# Patient Record
Sex: Male | Born: 1960 | Race: White | Hispanic: No | Marital: Married | Smoking: Current every day smoker
Health system: Southern US, Community
[De-identification: ages and names within clinical notes are randomized; demographics above are authoritative.]

## PROBLEM LIST (undated history)

## (undated) DIAGNOSIS — R45851 Suicidal ideations: Secondary | ICD-10-CM

## (undated) DIAGNOSIS — M549 Dorsalgia, unspecified: Secondary | ICD-10-CM

## (undated) DIAGNOSIS — F410 Panic disorder [episodic paroxysmal anxiety] without agoraphobia: Secondary | ICD-10-CM

## (undated) DIAGNOSIS — F419 Anxiety disorder, unspecified: Secondary | ICD-10-CM

## (undated) DIAGNOSIS — F32A Depression, unspecified: Secondary | ICD-10-CM

## (undated) DIAGNOSIS — F329 Major depressive disorder, single episode, unspecified: Secondary | ICD-10-CM

## (undated) HISTORY — PX: NASAL SEPTUM SURGERY: SHX37

## (undated) HISTORY — PX: BACK SURGERY: SHX140

## (undated) HISTORY — PX: THUMB FUSION: SUR636

## (undated) HISTORY — PX: APPENDECTOMY: SHX54

## (undated) HISTORY — PX: HERNIA REPAIR: SHX51

---

## 1999-09-07 ENCOUNTER — Encounter: Payer: Self-pay | Admitting: Neurosurgery

## 1999-09-07 ENCOUNTER — Ambulatory Visit (HOSPITAL_COMMUNITY): Admission: RE | Admit: 1999-09-07 | Discharge: 1999-09-07 | Payer: Self-pay | Admitting: Neurosurgery

## 1999-10-06 ENCOUNTER — Encounter: Payer: Self-pay | Admitting: Neurosurgery

## 1999-10-08 ENCOUNTER — Inpatient Hospital Stay (HOSPITAL_COMMUNITY): Admission: RE | Admit: 1999-10-08 | Discharge: 1999-10-09 | Payer: Self-pay | Admitting: Neurosurgery

## 1999-10-08 ENCOUNTER — Encounter: Payer: Self-pay | Admitting: Neurosurgery

## 2000-11-07 ENCOUNTER — Ambulatory Visit (HOSPITAL_COMMUNITY): Admission: RE | Admit: 2000-11-07 | Discharge: 2000-11-07 | Payer: Self-pay | Admitting: Neurosurgery

## 2000-11-07 ENCOUNTER — Encounter: Payer: Self-pay | Admitting: Neurosurgery

## 2000-12-05 ENCOUNTER — Encounter: Payer: Self-pay | Admitting: Neurosurgery

## 2000-12-05 ENCOUNTER — Encounter: Admission: RE | Admit: 2000-12-05 | Discharge: 2000-12-05 | Payer: Self-pay | Admitting: Neurosurgery

## 2001-01-16 ENCOUNTER — Encounter: Payer: Self-pay | Admitting: Neurological Surgery

## 2001-01-16 ENCOUNTER — Emergency Department (HOSPITAL_COMMUNITY): Admission: EM | Admit: 2001-01-16 | Discharge: 2001-01-16 | Payer: Self-pay | Admitting: Emergency Medicine

## 2001-01-18 ENCOUNTER — Encounter: Admission: RE | Admit: 2001-01-18 | Discharge: 2001-01-18 | Payer: Self-pay | Admitting: Internal Medicine

## 2001-01-19 ENCOUNTER — Ambulatory Visit (HOSPITAL_COMMUNITY): Admission: RE | Admit: 2001-01-19 | Discharge: 2001-01-19 | Payer: Self-pay

## 2001-01-20 ENCOUNTER — Inpatient Hospital Stay (HOSPITAL_COMMUNITY): Admission: EM | Admit: 2001-01-20 | Discharge: 2001-01-25 | Payer: Self-pay | Admitting: Emergency Medicine

## 2001-01-20 ENCOUNTER — Encounter: Payer: Self-pay | Admitting: Internal Medicine

## 2001-01-21 ENCOUNTER — Encounter: Payer: Self-pay | Admitting: Internal Medicine

## 2001-01-22 ENCOUNTER — Encounter: Payer: Self-pay | Admitting: Internal Medicine

## 2001-02-23 ENCOUNTER — Encounter: Admission: RE | Admit: 2001-02-23 | Discharge: 2001-02-23 | Payer: Self-pay | Admitting: Internal Medicine

## 2001-02-25 ENCOUNTER — Encounter: Payer: Self-pay | Admitting: Internal Medicine

## 2001-02-25 ENCOUNTER — Ambulatory Visit (HOSPITAL_COMMUNITY): Admission: RE | Admit: 2001-02-25 | Discharge: 2001-02-25 | Payer: Self-pay | Admitting: Internal Medicine

## 2001-06-22 ENCOUNTER — Inpatient Hospital Stay (HOSPITAL_COMMUNITY): Admission: RE | Admit: 2001-06-22 | Discharge: 2001-06-26 | Payer: Self-pay | Admitting: Neurosurgery

## 2001-06-22 ENCOUNTER — Encounter: Payer: Self-pay | Admitting: Neurosurgery

## 2002-01-31 ENCOUNTER — Encounter: Payer: Self-pay | Admitting: Neurosurgery

## 2002-01-31 ENCOUNTER — Ambulatory Visit (HOSPITAL_COMMUNITY): Admission: RE | Admit: 2002-01-31 | Discharge: 2002-01-31 | Payer: Self-pay | Admitting: Neurosurgery

## 2003-03-03 ENCOUNTER — Ambulatory Visit (HOSPITAL_COMMUNITY): Admission: RE | Admit: 2003-03-03 | Discharge: 2003-03-03 | Payer: Self-pay | Admitting: General Surgery

## 2003-03-03 ENCOUNTER — Encounter: Payer: Self-pay | Admitting: General Surgery

## 2003-09-24 ENCOUNTER — Ambulatory Visit (HOSPITAL_COMMUNITY): Admission: RE | Admit: 2003-09-24 | Discharge: 2003-09-24 | Payer: Self-pay | Admitting: General Surgery

## 2010-03-26 ENCOUNTER — Ambulatory Visit (HOSPITAL_COMMUNITY): Admission: RE | Admit: 2010-03-26 | Discharge: 2010-03-26 | Payer: Self-pay | Admitting: General Surgery

## 2010-08-21 ENCOUNTER — Encounter: Payer: Self-pay | Admitting: General Surgery

## 2010-10-15 LAB — CLOTEST (H. PYLORI), BIOPSY: Helicobacter screen: NEGATIVE — AB

## 2010-12-17 NOTE — Discharge Summary (Signed)
[place]. [facility]  Patient:    Frank Mills, Frank Mills                    MRN: [id number] Adm. Date:  [id number] Disc. Date: [id number] Attending:  Emeterio Reeve                           Discharge Summary  ADMITTING DIAGNOSIS:  Displaced disk, L5-S1, causing left lower extremity pain.   DISCHARGE DIAGNOSES:  Displaced disk, L5-S1, causing left lower extremity pain.  PROCEDURES:  Lumbar semi-hemilaminectomy, L5-S1, with microdissection.  COMPLICATIONS:  None.  DISCHARGE STATUS:  Alive and well.  HOSPITAL COURSE:  [name] at the time was admitted for evaluation of a herniated disk at L5-S1 causing left lower extremity pain.  He underwent an uncomplicated diskectomy at L5-S1.  Postoperatively, he has 5/5 strength except for some slight plantar flexion weakness on the left side, 5-/5.  His dressing is dry. He is able to ambulate, void, and was tolerating a regular diet.  He was given instructions of no bending, lifting, twisting, or driving.  He will be sent home and have a return appointment in approximately two weeks. DD:  10/09/99 TD:  10/10/99 Job: [id number]

## 2010-12-17 NOTE — H&P (Signed)
[place]. [facility]  Patient:    Frank Mills, KILLE Visit Number: [id number] MRN: [id number]          Service Type: Attending:  Payton Doughty, M.D. Dictated by:   Payton Doughty, M.D. Adm. 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  HISTORY OF PRESENT ILLNESS:  This is a 50 year old, right-handed, white gentleman who had a diskectomy at L5-S1 in March of 2001.  He did reasonably well and then last winter has increased low back pain and pain down his left leg.  He underwent epidural steroids which apparently resulted in some infection.  He was treated by the medical service.  MRIs were obtained that showed increase in disk on the left side at L4-5.  Because of progression of disease, intractable pain in his back and down his left leg, he is now admitted for a L4-5 and L5-S1 fusion.  PAST MEDICAL HISTORY:  Otherwise fairly unremarkable.  MEDICATIONS:  Vicodin.  ALLERGIES:  None.  PAST SURGICAL HISTORY:  Traumatic amputation of the thumb in 1993 and hernia repair in 1996.  SOCIAL HISTORY:  He smokes half of a pack of cigarettes a day.  He does not drink alcohol.  He is not working.  FAMILY HISTORY:  His mother is not known.  His father has what is described as fluid on his brain.  REVIEW OF SYSTEMS:  Remarkable for leg weakness, back pain, leg pain, and pain in the hip.  PHYSICAL EXAMINATION:  His HEENT exam is within normal limits.  NECK:  He has range of motion.  CHEST:  A few crackles.  CARDIAC:  Regular rate and rhythm.  ABDOMEN:  Nontender.  No hepatosplenomegaly.  EXTREMITIES:  No clubbing or cyanosis.  Peripheral pulses are good.  GENITOURINARY:  Exam is deferred.  NEUROLOGIC:  He is awake, alert, and oriented.  His cranial nerves are intact. The motor exam shows 5/5 strength throughout the upper and lower extremities at this time.  He has a positive straight leg  raising on the left and positive reverse straight leg raising per left leg pain with right leg pain.  Reflexes were intact save for the right ankle jerk which was absent.  PLAN:  Lumbar fusion at both L4-5 and L5-S1.  The risks and benefits of this approach have been discussed with him and he wishes to proceed. Dictated by:   Payton Doughty, M.D. Attending:  Payton Doughty, M.D. DD:  06/22/01 TD:  06/22/01 Job: [phone] [id number]

## 2010-12-17 NOTE — H&P (Signed)
[place]. [facility]  Patient:    Frank Mills, Frank Mills                      MRN: Adm. Date:  10/08/99 Attending:  Payton Doughty, M.D.                         History and Physical  ADMISSION DIAGNOSIS:  Herniated disk at L5-S1.  HISTORY OF PRESENT ILLNESS:  This is a 50 year old right-handed white gentleman who noted back pain in 1998.  He had a fall in 1996 that may have started this.  He has had an MRI that showed pathology at 5-1 in 1998.  He had several bouts of epidural steroids, physical therapy to 10 units, and he has worsened pain in his back, into his right leg, and into his right foot.  In June, he was studied with an MRI which showed a central disk at 5-1.  He came to me from a chiropractors office.  He was studied with an MRI that showed a 5-1 disk as well as a left-sided extraforaminal disk.  He has had an increase in his left leg pain and is bothered bilaterally.  MRI shows extraforaminal disk on the left side at 4-5 and at 5-1, and he is now  admitted for diskectomy.  PAST MEDICAL HISTORY:  Otherwise unremarkable.  MEDICATIONS:  None.  ALLERGIES:  None.  PAST SURGICAL HISTORY:  Traumatic amputation of left thumb in 1993, a hernia repair in 1996.  SOCIAL HISTORY:  He smokes one-half pack of cigarettes per day, does not drink alcohol.  He is not working at this time.  FAMILY HISTORY:  Mom is unknown.  Daddy is 67 with fluid on his brain.  REVIEW OF SYSTEMS:  Remarkable for leg weakness, back pain, leg pain, and pain n his hip.  PHYSICAL EXAMINATION:  HEENT:  Within normal limits.  NECK:  He has a reasonable range of motion of his neck.  CHEST:  A few crackles.  CARDIAC:  Regular rate and rhythm.  ABDOMEN:  Nontender, no hepatosplenomegaly.  EXTREMITIES:  Without clubbing or cyanosis.  Peripheral pulses are good.  GU:  Deferred.  NEUROLOGIC:  He is awake, alert, and oriented.  His cranial nerves are intact.  Motor  exam shows 5/5 strength throughout upper and lower extremities save for plantar flexion weakness on the right side.  Sensory deficit describes right S1  distribution.  Deep tendon reflexes are 2 at the knees, 1 at the ankles bilaterally.  Straight leg raise and reverse straight raise are both exquisitely positive for back and right hip and leg pain.  LABORATORY DATA:  MRI results were reviewed above.  CLINICAL IMPRESSION:  Right S1 radiculopathy.  The onset of left leg pain may be related to the 4-5 disk.  We are going to check him again and certainly take the 5-1 disk out and perhaps explore extraforamin t L4-L5.  The risks and benefits of this approach have been discussed with him, and he wishes to proceed. DD:  10/08/99 TD:  10/08/99 Job: [id number]

## 2010-12-17 NOTE — Op Note (Signed)
[place]. [facility]  Patient:    Frank Mills, Frank Mills Visit Number: [id number] MRN: [id number]          Service Type: SUR Location: 3000 [id number] Attending Physician:  Emeterio Reeve Dictated by:   Payton Doughty, M.D. Proc. Date: 06/22/01 Admit Date:  06/22/2001                             Operative Report  PREOPERATIVE DIAGNOSIS:  Spondylosis of L5-S1 and herniated disk, L4-5.  POSTOPERATIVE DIAGNOSIS:  Spondylosis of L5-S1 and herniated disk, L4-5.  OPERATIVE PROCEDURE:  L4-5 and L5-S1 laminectomy and diskectomy, posterior lumbar interbody fusion with a Ray threaded fusion cage.  SURGEON:  Payton Doughty, M.D.  ASSISTANT:  Mena Goes. Franky Macho, M.D.  ANESTHESIA:  General endotracheal.  PREPARATION:  Prepped with sterile Betadine, and prepped and scrubbed with alcohol wipe.  COMPLICATIONS:  None.  INDICATIONS:  A 50 year old right-handed white gentleman with severe spondylosis.  DESCRIPTION OF PROCEDURE:  He was taken to the operating room, smoothly anesthetized and intubated.  Placed prone on the operating table.  Following shave, prep, and drape in the usual sterile fashion, the skin was infiltrated with 1% lidocaine, 1:_______ epinephrine.  The skin was incised from the top of the L4 to mid S1, and the lamina of L4 and L5 were exposed bilaterally in the subperiosteal plane over the 4-5 and 5-1 facet joints.  Intraoperative x-ray confirmed correctness of level.  The pars interarticularis, remaining lamina, and inferior facet of L4 and L5, and the superior facet of L5 and S1 were removed bilaterally, and the bone set aside for grafting.  Following this, the nerve roots were dissected as _________ their respective pedicles by removal of the ligamentum flavum and coagulation of the epidural veins.  Diskectomy was then carried out at each level.  On the left side at 4-5, there was a herniated disk in the neuroforamen which compressed both the 4 and 5  roots as they traversed this area.  At L5-S1, there was significant degenerative disease and bilateral foraminal narrowing.  Following complete resection of the disk, the 14 x 26 mm Ray cages were placed using standard technique.  Following this, intraoperative x-ray showed good placement of the cages.  They were packed with bone graft, harvested with facet joints, and capped.  The wound was once again irrigated, hemostasis assured, and the fascia was reapproximated with 0 Vicryl in an interrupted fashion.  The subcutaneous tissue was reapproximated with 0 Vicryl in an interrupted fashion.  The subcuticular tissues reapproximated with 3-0 Vicryl in an interrupted fashion and the skin was closed with 3-0 nylon in a running locked fashion.  Betadine Telfa dressing was applied and made occlusive with Op-Site.  The patient then returned to the recovery room in good condition. Dictated by:   Payton Doughty, M.D. Attending Physician:  Emeterio Reeve DD:  06/22/01 TD:  06/24/01 Job: [id number]

## 2010-12-17 NOTE — Op Note (Signed)
. [facility]  Patient:    Frank Mills, Frank Mills                    MRN: [id number] Proc. Date: 10/08/99 Adm. Date:  [id number] Disc. Date: [id number] Attending:  Emeterio Reeve                           Operative Report  PREOPERATIVE DIAGNOSIS:  Herniated disk at L5-S1.  POSTOPERATIVE DIAGNOSIS:  Herniated disk at L5-S1.  PROCEDURE:  L5-S1 bilateral laminectomy and diskectomy.  SURGEON:  Payton Doughty, M.D.  ANESTHESIA:  General endotracheal.  PREP:  Shave and prepped, scrubbed with alcohol wipe.  COMPLICATIONS:  None.  BODY OF TEXT:  A 50 year old right-handed, white gentleman with bilateral S1 radiculopathies.  Some consideration had been given to doing a 4-5 extraforaminal disk, but he had no L4 or L5 symptoms.  He was taken to the operating room, smoothly anesthetized, intubated and placed prone on the operating table. Following shave, prepped and draped in usual sterile fashion.  Skin was infiltrated with 1% lidocaine with 1:400,000 epinephrine.  Skin incision was made from mid 1 to mid L5 and the lamina of L5 and S1 were exposed bilaterally in subperiosteal  plane.  Intraoperative x-ray confirmed correctness level.  Hemisemilaminectomy as carried out first on the right and then on the left to the top and bottom of the ligamentum flavum, removing part of the lamina of L5 and part of the lamina of 1 on each side.  Ligamentum flavum was removed and careful dissection revealed the S1 nerve root as it coursed across the disk space and out around its pedicle.  On he right side, there was a significant bulging disk with some inferiorly migrated fragment that was removed without difficulty.  This resolved the immediate compression of the right S1 nerve root.  On the left side, there was also bulging disk, but the disk herniation right at the disk space.  It was protruding through the annulus, but was not a true free fragment.  Annular  fibers were divided and the disk was removed.  The disk space was carefully explored bilaterally and all marginally clinging fragments were removed.  Following this, the wound was irrigated and hemostasis assured.  Both S1 nerve roots were explored, as well as, the anterior epidural space and no fragments were demonstrated.  Depo Medrol-soaked fat was then placed in each laminectomy defect.  The fascia was reapproximated ith 0 Vicryl in interrupted fashion, subcutaneous tissues reapproximated with 0 Vicryl in interrupted fashion, the subcuticular tissues reapproximated with 3-0 Vicryl in interrupted fashion and the skin was closed with 4-0 Vicryl in a running subcuticular fashion.  Benzoin and Steri-Strips were placed and made occlusive Telfa and OpSite.  The patient was then returned recovery room in good condition. DD:  10/08/99 TD:  10/10/99 Job: [id number]

## 2010-12-17 NOTE — Discharge Summary (Signed)
[place]. [facility]  Patient:    MADISON, ALBEA                    MRN: [id number] Adm. Date:  [id number] Disc. Date: [id number] Attending:  Madaline Guthrie CC:         Dr. Channing Mutters   Discharge Summary  DATE OF BIRTH:  [birth year]  FOLLOWED BY:  Dr. Channing Mutters, neurosurgeon, [phone].  DISCHARGE DIAGNOSES: 1. Parameningeal focus of infection. 2. Ileus secondary to long-term chronic pain medicine. 3. Urinary retention, also secondary to long-term narcotic pain medicine. 4. Disc herniation on left at L4-L5 diagnosed by MRI on April 2002. 5. Disc surgery on L5-S1 April 2001.  DISCHARGE MEDICATIONS: 1. Ceftriaxone 2 g IV q.12h. x 14 days, ceftriaxone 2 g IV q.d. for a month,    then start gatifloxacin 400 mg p.o. q.d. x one month. 2. Percocet 5/325 two tabs q.6h. p.r.n. pain. 3. Senokot 2 tabs q.i.d. p.r.n. while taking Percocet or p.r.n. constipation.  FOLLOWUP:  The patient is scheduled to see Dr. Felicity Coyer at [facility] on July 26 at 10:45 a.m.  At that time, it is recommended by the infectious disease consult to check a Sed rate as well as schedule the patient for a followup MRI of the spine looking for remaining site of infection or abscess. The patient is also scheduled to follow up with his neurosurgeon, Dr. Channing Mutters, at his office on September 11 at 4:15 p.m. in regards to his disc herniation.  PROCEDURES: 1. Chest x-ray showed lungs to be clear without effusion or pneumothorax. 2. Lumbar puncture showed 1500 white cells, 1070 red cells with 83%    neutrophils.  Glucose less than 5.  Protein 337 and Gram stain showed    gram-positive cocci which later grew as staph aureus methicillin-sensitive. 3. CT of the abdomen, pelvis showed ______ abscess, showed a dilated colon    with moderate amounts of stool, most likely ileus without any acute    abnormalities within the abdomen. 4. PICC line placement on hospital day #3.  CONSULTANTS:  An infectious disease  consult was asked for.  They advised starting treatment with IV vancomycin and ceftriaxone.  Once cultures with sensitivities were back and discovered to be methicillin staph aureus, the patient was switched to ceftriaxone only.  It was recommended that the antibiotic regimen consist of two weeks of 2 g IV ceftriaxone every 12 hours followed by one month of ceftriaxone 2 g IV once a day, followed by one month of oral gatifloxacin.  It was also suggested that the patient be followed with Sed rates as well as a repeat MRI of the spine at one month looking for any remaining sites of infection.  ADMITTING HISTORY AND PHYSICAL:  The patient is a 50 year old white male with a history of herniated disc surgery in April 2001.  He presents with several months of intractable back pain as well as pain in the hips, backs of legs bilaterally and neck.  The patient states that he recovered well from his back surgery approximately a year ago, but then started to feel pain in February 2002.  It progressively got worse, but he was imaged with an MRI in April 2002, where it was shown that he had developed a disc herniation on the left at L4-L5.  He was then scheduled by his neurosurgeon, Dr. Channing Mutters, to receive a course of three epidural steroid injections.  He received his first dose in May  2002 and states that this made him fell worse so that he refused to keep appointments for the subsequent two steroid injections.  Since then, he has felt like his pain had gotten progressively worse so that he had been visiting area ERs seeking diagnoses and control of his pain.  He presented to the [facility] Emergency Room four days prior to this admission with the same complaint where he was found to have leukocytosis with a white blood cell count of 17.5. Because of his symptoms and leukocytosis, a repeat MRI and full-body bone scan were arranged as well as a followup outpatient clinic on the day prior to admission.  A  repeat MRI showed the same L4-L5 disc herniation but otherwise was unchanged since his previous exam in April 2002.  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  Blood cultures were also drawn at that ER visit, but have remained negative to date.  A Sed rate was also shown to be normal.  He did also test positive for cocaine on a urine drug screen. At the emergency room visit on the day of admission, the patient had very similar complaints and said that this pain had continued without relief.  It had been constant sharp and there was no associated numbness.  He denies any stress incontinence to urine or stool.  His complaints were otherwise nonfocal as well as his exam.  He did have subjective feelings of fever and chills, but he had been taking large doses of pain medications including Percocet, darvocet, which included antipyretics.  He denied any complaints of headache, photophobia, nausea or vomiting.  At presentation, continued to have a leukocytosis of 17.7 and an ANC of 15.4.  It was therefore decided to do a lumbar puncture, which showed evidence of a bacterial infection with gram-positive cocci on Gram stain.  However, this was a very unusual presentation given the fact that the patient did not show any signs of frank meningitis.  Despite going over the previous spine MRIs a second time with radiologist, a focus of infection was still not shown on imaging.  Regardless, the patient was admitted to the hospital and started on IV antibiotics.  HOSPITAL COURSE: #1 - PARAMENINGEAL FOCUS OF INFECTION:  given the results of the lumbar puncture, the patient seemed to have a bacterial source of infection near or within his meninges.  He was therefore started on broad spectrum antibiotics, IV antibiotics, covered with vancomycin and ceftriaxone.  Sensitivities returned on day #3 of  hospitalization and showed methicillin sensitive staph  aureus.  The vancomycin was therefore discontinued and the patient was kept only on ceftriaxone IV.  Given the fact that the patient would have to have 4-6 weeks of IV antibiotics, he had a PICC placement also on hospital day #3. Over the course of his hospitalization, the patient had decreased episodes of chills and he defervesced nicely.  On discharge, his white blood cell count was 13.2.  He also required less and less pain medication to control his pain. The patient was discharged on a six week course of ceftriaxone as detailed above.  Home health nurse was arranged to deliver the antibiotics on a daily basis.  A follow-up appointment was made in the residents [facility] clinic in a month in order to assess how well the patient will respond.  At that time, the patient will be switched to oral gatifloxacin for  one month further of antibiotics.  He will also have a Sed rate checked and a follow-up MRI of the spine will be scheduled.  #2 - CONSTIPATION:  On admission, the patient stated that he had at least one week without a stool.  It is postulated that this is most likely secondary to his several week use of narcotic pain medications that had been given to him in the almost daily emergency room visits that he had been seeking.  This was confirmed on his CT of the abdomen and pelvis where the colon was dilated with moderate amounts of stool.  He was therefore started on Senokot at first, supplemented by GoLYTELY.  By hospital day #5, the patient was stooling regularly and his symptoms has resolved.  #3 - URINARY RETENTION:  On presentation in the emergency room, the patient stated that he had not been able to urinate that day.  A Foley catheter was placed with the result of approximately 1200 cc of urine.  The patient then insisted on taking the Foley catheter out.  However, he was unable to produce urine over the next several hours, so  a Foley catheter was reintroduced.  It was again most likely this his urinary retention is a result of his several week narcotic pain medication use.  As his pain relatively decreased as the infection was treated, he was requiring lower and lower doses of narcotic pain medicines so that by hospital day #6, the day of discharge, the Foley catheter could be withdrawn and he was able to produce urine freely without having to strain.  #4 - PAIN CONTROL:  The patients chief complaint was severe pain.  He was already on several different narcotic medicines including Percocet as well as Ultram.  He was started on a PCA pump on hospital day #2, which did well at controlling his pain.  He was requiring as much as 120 mg of morphine at that time.  On hospital day #3, he was taken off the PCA and started on MS Contin at an equivalent dose.  On hospital day #5, his mental status changed so that he became very lethargic and only alert and oriented x 2.  However, exam was nonfocal.  His somnolence was thought to be due secondary to the high dose of MS Contin that he was on.  His MS Contin was therefore held for 24 hours and his mental status cleared, so that he was alert and oriented x 3 by the following morning.  It was decided then to discharge him home on Percocet 5/325 two tabs q.6h. in the hopes that this would adequately treat his pain without making his somnolent, constipated, or have urinary retention.  He was given the phone number of the [facility] and advised to give the clinic a call if his pain control was inadequate.  DISCHARGE LABORATORY DATA:  Sodium 136, potassium 3l9, chloride 99, bicarb 30, BUN 7, creatinine 0.7, glucose 99, calcium 8.5, white blood cell count 13.0. Hemoglobin 13.2, hematocrit 38.9, platelets 351. DD:  01/26/01 TD:  01/26/01 Job: [id number]

## 2010-12-17 NOTE — Consult Note (Signed)
[place]  Patient:    Frank Mills, Frank Mills                    MRN: [id number] Proc. Date: 01/16/01 Adm. Date:  [id number] Attending:  Molpus, Carlisle Beers                          Consultation Report  REQUESTOR:  Janyce Llanos, M.D.  REASON FOR CONSULTATION:  Intractable back pain.  HISTORY OF PRESENT ILLNESS:  The patient is a 50 year old individual who is a patient of Dr. 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  He has been treated with some epidural steroid injections which he had a month ago.  According to the history the patient has had numerous visits to the emergency room at [facility] and has been given shots for pain medication.  He presented to the emergency room today writhing with back pain and was inconsolable.  He was given a few mg of IV morphine which made him somewhat more comfortable.  Urine specimen was positive for cocaine, which the patient admits to have taken to try to ameliorate the pain.  Because of the history of epidural steroid injections a month ago, CBC and sed rate was suggested, and a repeat MRI was also order.  The repeat MRI has demonstrated that the patient has a foraminal disc herniation on the left at L4/L5 which is unchanged from before.  The patient also has evidence of an elevated white count of 17,500 with 90% polymorphonuclear cells and some toxic granulation is noted.  The patients urinalysis is otherwise unremarkable.  There are no red cells or white cells within the urine.  PHYSICAL EXAMINATION:  GENERAL:  The patient described diffuse back pain.  He has been quite heavily sedated.  EXTREMITIES:  He has no overt muscle spasm to palpation or percussion.  His motor strength in the lower extremities is good to confrontational testing. Deep tendon reflexes are 1+ in the patellae, 1+ in the Achilles.  Babinskis are downgoing.  Straight leg  raise reproduces back pain locally.  There is no masses palpable in the groin.  Femoral pulses are intact and peripheral pulses are intact.  No edema is noted in the distal lower extremities.  LUNGS:  Clear to auscultation.  HEART:  Regular rate and rhythm.  ABDOMEN:  Soft.  IMPRESSION/PLAN:  The patient at this time appears to have some diffuse nonspecific back pain.  There is no specific localizing feature in his lumbar spine.  The disc herniation that he has at L4/L5 appears to be unchanged from description from the study in April 2002 and his bilateral leg pain does not fit with the presence of a foraminal disc herniation at L4/L5 on left.  I discussed the situation with Dr. Samuel Germany at some length.  My concern is the nature of his white count elevation.  He has not been febrile and has no other signs of infection.  At this time he will be ordering some blood cultures to be followed up in the medical clinic in the next two days.  I suggested giving the patient Ultram for pain and discussed the patients pain medication usage. His girlfriend notes that he has been using four to five hydrocodone at a time.  I indicated the amount of Tylenol that he is exposing himself to is in  fact dangerous to his liver and his bloodstream.  A bone scan will also be ordered by Dr. Samuel Germany.  The patient will be following up with Dr. Channing Mutters at his routinely scheduled appointment at the end of this month. DD:  01/16/01 TD:  01/16/01 Job: [id number]

## 2010-12-17 NOTE — Consult Note (Signed)
[place]  Patient:    Frank Mills, Frank Mills Visit Number: [id number] MRN: [id number]          Service Type: SUR Location: 3000 3022 [id number] Attending Physician:  Emeterio Reeve Dictated by:   Bertram Millard. Dahlstedt, M.D. Proc. Date: 06/26/01 Admit Date:  06/22/2001   CC:         Payton Doughty, M.D.                          Consultation Report  REASON FOR CONSULTATION:  Inability to urinate.  BRIEF HISTORY:  A 50 year old male status post recent lumbar surgery.  He had a prior operation several years ago.  He had urinary retention after that and true to form, has had urinary retention after this surgery.  The patient was given a trial of Flomax yesterday which did not help with urination.  Catheter was replaced.  The patient has had significant suprapubic discomfort as well. He has the constant need to urinate.  This has been improved overnight with pain management.  Prior to hospitalization the patient denies any voiding symptoms.  He emptied well and had no significant history of urinary tract infections.  PAST UROLOGIC HISTORY:  Noncontributory except for prior retention.  PHYSICAL EXAMINATION  GENERAL:  Somewhat disheveled appearing adult male.  ABDOMEN:  Flat, soft, nondistended with mild suprapubic tenderness.  Bladder was nonpalpable.  There were no inguinal hernias.  No hepatosplenomegaly or masses were present.  GENITOURINARY:  Phallus circumcised without lesions.  No plaques, fibrotic areas noted.  Glans normal.  Meatus normal location and size.  Catheter present from the meatus.  Scrotal skin unremarkable.  Testicles unremarkable.  RECTAL:  Normal anal sphincter tone.  Gland 1-2+.  No tenderness.  No nodularity.  No rectal masses.  Seminal vesicles nonpalpable.  IMPRESSION: 1. Retention, specified, postoperative. 2. Suprapubic discomfort.  PLAN: 1. Agree to continue him on Flomax. 2. Will leave catheter in until Friday  morning. 3. Discussed taking this catheter out with his wife.  They will remove it    first thing for a voiding trial. 4. If he voids well I will follow up with him in two weeks.  If he does not    void well he will call me that day for further management. Dictated by:   Bertram Millard. Dahlstedt, M.D. Attending Physician:  Emeterio Reeve DD:  06/26/01 TD:  06/26/01 Job: [id number]

## 2012-01-19 ENCOUNTER — Emergency Department (HOSPITAL_COMMUNITY)
Admission: EM | Admit: 2012-01-19 | Discharge: 2012-01-19 | Disposition: A | Discharge status: Other Acute Inpatient Hospital | Payer: Medicare Other | Source: Home / Self Care | Attending: Emergency Medicine | Admitting: Emergency Medicine

## 2012-01-19 ENCOUNTER — Encounter (HOSPITAL_COMMUNITY): Payer: Self-pay

## 2012-01-19 DIAGNOSIS — R45851 Suicidal ideations: Secondary | ICD-10-CM | POA: Insufficient documentation

## 2012-01-19 DIAGNOSIS — T5891XA Toxic effect of carbon monoxide from unspecified source, accidental (unintentional), initial encounter: Secondary | ICD-10-CM

## 2012-01-19 DIAGNOSIS — F3289 Other specified depressive episodes: Secondary | ICD-10-CM | POA: Insufficient documentation

## 2012-01-19 DIAGNOSIS — T5894XA Toxic effect of carbon monoxide from unspecified source, undetermined, initial encounter: Secondary | ICD-10-CM | POA: Insufficient documentation

## 2012-01-19 DIAGNOSIS — F329 Major depressive disorder, single episode, unspecified: Secondary | ICD-10-CM | POA: Insufficient documentation

## 2012-01-19 DIAGNOSIS — T5802XA Toxic effect of carbon monoxide from motor vehicle exhaust, intentional self-harm, initial encounter: Secondary | ICD-10-CM | POA: Insufficient documentation

## 2012-01-19 HISTORY — DX: Depression, unspecified: F32.A

## 2012-01-19 HISTORY — DX: Dorsalgia, unspecified: M54.9

## 2012-01-19 HISTORY — DX: Panic disorder (episodic paroxysmal anxiety): F41.0

## 2012-01-19 HISTORY — DX: Anxiety disorder, unspecified: F41.9

## 2012-01-19 HISTORY — DX: Major depressive disorder, single episode, unspecified: F32.9

## 2012-01-19 HISTORY — DX: Suicidal ideations: R45.851

## 2012-01-19 LAB — CBC
HCT: 44.6 % (ref 39.0–52.0)
MCH: 31.7 pg (ref 26.0–34.0)
MCHC: 33.6 g/dL (ref 30.0–36.0)
MCV: 94.3 fL (ref 78.0–100.0)
RDW: 13.3 % (ref 11.5–15.5)

## 2012-01-19 LAB — DIFFERENTIAL
Basophils Absolute: 0.1 10*3/uL (ref 0.0–0.1)
Basophils Relative: 1 % (ref 0–1)
Eosinophils Absolute: 0.5 10*3/uL (ref 0.0–0.7)
Eosinophils Relative: 4 % (ref 0–5)
Monocytes Absolute: 0.6 10*3/uL (ref 0.1–1.0)
Monocytes Relative: 5 % (ref 3–12)

## 2012-01-19 LAB — RAPID URINE DRUG SCREEN, HOSP PERFORMED
Amphetamines: NOT DETECTED
Cocaine: POSITIVE — AB
Opiates: NOT DETECTED
Tetrahydrocannabinol: NOT DETECTED

## 2012-01-19 LAB — COMPREHENSIVE METABOLIC PANEL
AST: 21 U/L (ref 0–37)
Albumin: 3.6 g/dL (ref 3.5–5.2)
BUN: 10 mg/dL (ref 6–23)
Calcium: 9.4 mg/dL (ref 8.4–10.5)
Creatinine, Ser: 1.19 mg/dL (ref 0.50–1.35)

## 2012-01-19 LAB — CARBOXYHEMOGLOBIN
Methemoglobin: 1 % (ref 0.0–1.5)
O2 Saturation: 99.6 %
Total hemoglobin: 14.7 g/dL (ref 13.5–18.0)
Total oxygen content: 20.7 mL/dL (ref 15.0–23.0)

## 2012-01-19 LAB — BLOOD GAS, ARTERIAL
Acid-Base Excess: 0.6 mmol/L (ref 0.0–2.0)
TCO2: 21.4 mmol/L (ref 0–100)
pCO2 arterial: 38.8 mmHg (ref 35.0–45.0)

## 2012-01-19 LAB — CARDIAC PANEL(CRET KIN+CKTOT+MB+TROPI)
CK, MB: 1.7 ng/mL (ref 0.3–4.0)
Troponin I: <0.3 ng/mL (ref <0.30)

## 2012-01-19 LAB — ETHANOL: Alcohol, Ethyl (B): <11 mg/dL (ref 0–11)

## 2012-01-19 MED ORDER — LORAZEPAM 1 MG PO TABS: 1.0000 mg | ORAL_TABLET | Freq: Three times a day (TID) | ORAL | Status: DC | PRN

## 2012-01-19 MED ORDER — ACETAMINOPHEN 325 MG PO TABS: 650.0000 mg | ORAL_TABLET | ORAL | Status: DC | PRN

## 2012-01-19 MED ORDER — ONDANSETRON HCL 4 MG PO TABS: 4.0000 mg | ORAL_TABLET | Freq: Three times a day (TID) | ORAL | Status: DC | PRN

## 2012-01-19 MED ORDER — NICOTINE 21 MG/24HR TD PT24
21.0000 mg | MEDICATED_PATCH | Freq: Every day | TRANSDERMAL | Status: DC
  Filled 2012-01-19: qty 1

## 2012-01-19 NOTE — ED Notes (Signed)
Patient reports SI for 1-2 weeks. "I don't know I guess I'm just bored" Patient chuckling at times when giving explanation of why he is in the ED. Patient reports he tried to kill himself by locking himself in the car with a hose from the exhaust. Patient is alert/oriented x 4 at present and cooperative with nursing staff and police. [employer] at bedside for patient observation.

## 2012-01-19 NOTE — ED Notes (Signed)
Pt brought to er by [facility], commitment papers in place, admits to si, denies any si, stated he has been depressed for a long while. Hooked pipe to car trying to kill self today.  Admits to Cocaine abuse. Denies any etoh use.

## 2012-01-19 NOTE — Progress Notes (Signed)
Pt accepted to [facility] by Dr Orson Aloe to room 504-2.  Dr Manus Gunning agrees with disposition.

## 2012-01-19 NOTE — BH Assessment (Signed)
Assessment Note   Frank Mills is an 51 y.o. male.  PT REPORTS HE HAD NEVER FELT SUICIDAL UNTIL ABOUT 2 WEEKS AGO HE HAD SUICIDAL THOUGHTS AFTER HAVING A COCAINE BINGE AND THEN AGAIN TODAY AFTER BINGING ON ABOUT  $140.00 WORTH OF COCAINE .  HE REPORTS WHILE BINGING LAST NIGHT HE REFUSED TO TAKE HIS WIFE'S COLLECT CALLS FROM JAIL AND HE REALIZED HE LET HIS SON DOWN AND THEN WANTED TO TAKE HIS LIFE. PT REPORTS NEVER FEELING THIS DEPRESSED BEFORE AND ALL THIS IS NEW TO HIM. HE REPORTS BEING GIVEN A NEW MEDICATION FOR INFLAMMATION AS IT RELATES TO HIS BACK PAIN  AND FEELS THIS MEDICATION ALONG WITH HIS CRACK/COCAIN USE CAUSED HIM TO FEEL SUICIDAL. PT DENIES H/I AND IS NOT PSYCHOTIC. PT TEXT HIS BROTHER IN LAW ABOUT WHAT HE HAD DONE AND THE SHERIFF WAS SENT TO PT'S HOME.   Axis I: Depressive Disorder NOS Axis II: Deferred Axis III:  Past Medical History  Diagnosis Date  . Back pain   . Depression   . Suicidal ideation   . Anxiety   . Panic    Axis IV: economic problems, problems related to social environment and problems with primary support group Axis V: 11-20 some danger of hurting self or others possible OR occasionally fails to maintain minimal personal hygiene OR gross impairment in communication  Past Medical History:  Past Medical History  Diagnosis Date  . Back pain   . Depression   . Suicidal ideation   . Anxiety   . Panic     Past Surgical History  Procedure Date  . Back surgery   . Hernia repair   . Appendectomy   . Thumb fusion   . Nasal septum surgery     Family History: No family history on file.  Social History:  reports that he has been smoking.  He does not have any smokeless tobacco history on file. He reports that he uses illicit drugs (Cocaine). He reports that he does not drink alcohol.  Additional Social History:  Alcohol / Drug Use Pain Medications: NA Prescriptions: NA Over the Counter: NA History of alcohol / drug use?: Yes Negative Consequences  of Use: Financial Substance #1 Name of Substance 1: CRACK/COCAINE 1 - Age of First Use: 32 1 - Amount (size/oz): 140+ 2 X PER MONTH (BINGE FOR 1 DAY) 1 - Frequency: 2 X MONTH 1 - Duration: 18 YRS 1 - Last Use / Amount: 01/18/12/140.00 WORTH  CIWA: CIWA-Ar BP: 119/68 mmHg Pulse Rate: 70  Tactile Disturbances: none Tremor: no tremor Auditory Disturbances: not present Paroxysmal Sweats: no sweat visible Visual Disturbances: not present Anxiety: no anxiety, at ease Headache, Fullness in Head: none present Agitation: normal activity Orientation and Clouding of Sensorium: oriented and can do serial additions COWS:    Allergies: No Known Allergies  Home Medications:  (Not in a hospital admission)  OB/GYN Status:  No LMP for male patient.  General Assessment Data Location of Assessment: [facility] ACT Assessment: Yes Living Arrangements: Other relatives (SISTER-IN LAW-WIFE IN JAIL-PROBATION VIOLATION) Can pt return to current living arrangement?: Yes Admission Status: Involuntary Is patient capable of signing voluntary admission?: No Transfer from: Acute Hospital [facility]) Referral Source: MD (DR Glynn Octave)  Education Status Contact person: FARAH BENISH [phone]  Risk to self Suicidal Ideation: Yes-Currently Present Suicidal Intent: Yes-Currently Present Is patient at risk for suicide?: Yes Suicidal Plan?: Yes-Currently Present Specify Current Suicidal Plan: CONNECT HOSE TO EXHAUST Access to Means: Yes Specify  Access to Suicidal Means: HAS CAR What has been your use of drugs/alcohol within the last 12 months?: CRACK/COCAINE Previous Attempts/Gestures: No How many times?: 0  Other Self Harm Risks: NA Triggers for Past Attempts: None known Intentional Self Injurious Behavior: None Family Suicide History: No Recent stressful life event(s): Financial Problems;Other (Comment) (WIFE IN JAIL, LET SON DOWN) Persecutory voices/beliefs?: No Depression:  Yes Depression Symptoms: Despondent;Guilt;Loss of interest in usual pleasures;Feeling worthless/self pity Substance abuse history and/or treatment for substance abuse?: Yes Suicide prevention information given to non-admitted patients: Not applicable  Risk to Others Homicidal Ideation: No Thoughts of Harm to Others: No Current Homicidal Intent: No Current Homicidal Plan: No Access to Homicidal Means: No History of harm to others?: No Assessment of Violence: None Noted Violent Behavior Description: NA Does patient have access to weapons?: No Criminal Charges Pending?: No Does patient have a court date: No  Psychosis Hallucinations: None noted Delusions: None noted  Mental Status Report Appear/Hygiene: Improved Eye Contact: Good Motor Activity: Freedom of movement Speech: Logical/coherent Level of Consciousness: Alert Mood: Depressed;Despair Affect: Depressed;Sad Anxiety Level: Minimal Thought Processes: Coherent;Relevant Judgement: Impaired Orientation: Person;Place;Time;Situation Obsessive Compulsive Thoughts/Behaviors: None  Cognitive Functioning Concentration: Normal Memory: Recent Intact;Remote Intact IQ: Average Insight: Poor Impulse Control: Poor Appetite: Good Sleep: No Change Total Hours of Sleep: 8  Vegetative Symptoms: None  ADLScreening [facility] Assessment Services) Patient's cognitive ability adequate to safely complete daily activities?: Yes Patient able to express need for assistance with ADLs?: Yes Independently performs ADLs?: Yes  Abuse/Neglect [facility]) Physical Abuse: Denies Verbal Abuse: Denies Sexual Abuse: Denies  Prior Inpatient Therapy Prior Inpatient Therapy: No  Prior Outpatient Therapy Prior Outpatient Therapy: No  ADL Screening (condition at time of admission) Patient's cognitive ability adequate to safely complete daily activities?: Yes Patient able to express need for assistance with ADLs?: Yes Independently performs ADLs?:  Yes Weakness of Legs: None  Home Assistive Devices/Equipment Home Assistive Devices/Equipment: None  Therapy Consults (therapy consults require a physician order) PT Evaluation Needed: No OT Evalulation Needed: No SLP Evaluation Needed: No Abuse/Neglect Assessment (Assessment to be complete while patient is alone) Physical Abuse: Denies Verbal Abuse: Denies Sexual Abuse: Denies Exploitation of patient/patient's resources: Denies Self-Neglect: Denies Values / Beliefs Cultural Requests During Hospitalization: None Spiritual Requests During Hospitalization: None Consults Spiritual Care Consult Needed: No Social Work Consult Needed: No      Additional Information 1:1 In Past 12 Months?: No CIRT Risk: No Elopement Risk: No Does patient have medical clearance?: Yes     Disposition:   REFERRED TO [facility]                                                                             Disposition Disposition of Patient: Inpatient treatment program Type of inpatient treatment program: Adult  On Site Evaluation by:   Reviewed with Physician:  DR Gardiner Fanti Winford 01/19/2012 7:52 PM

## 2012-01-19 NOTE — ED Notes (Signed)
Patient placed on NRB at 15 L/minute per MD order.

## 2012-01-19 NOTE — ED Notes (Signed)
Per pt his cell phone was given to his son at this time to take home all other belongings remain locked up in the er. Misty Stanley

## 2012-01-19 NOTE — ED Notes (Signed)
Dr Manus Gunning made aware of Carboxyhemoglobin level. RT recommended NRB at 15 L/min for 30 minutes. Dr Manus Gunning made aware and agreed.

## 2012-01-19 NOTE — ED Notes (Signed)
Family at bedside. 

## 2012-01-19 NOTE — ED Provider Notes (Addendum)
History   This chart was scribed for Glynn Octave, MD by Clarita Crane. The patient was seen in room APA16A/APA16A. Patient's care was started at 1510.    CSN: [id number]  Arrival date & time 01/19/12  1510   First MD Initiated Contact with Patient 01/19/12 1524      Chief Complaint  Patient presents with  . V70.1    (Consider location/radiation/quality/duration/timing/severity/associated sxs/prior treatment) HPI Frank Mills is a 51 y.o. male who presents to the Emergency Department after being BIB police complaining of suicidal ideations onset 3 weeks ago and gradually worsening since. Patient reports making suicide attempt today by attaching hose to exhaust on car and running the car for 1 hour. Also reports having plan to drive vehicle into telephone pole. Patient currently denies chest pain, SOB, HA, abdominal pain and states he feels like he is at baseline physically following suicide attempt today. Patient with h/o back pain, anxiety, back surgery.    Past Medical History  Diagnosis Date  . Back pain   . Depression   . Suicidal ideation   . Anxiety   . Panic     Past Surgical History  Procedure Date  . Back surgery   . Hernia repair   . Appendectomy   . Thumb fusion   . Nasal septum surgery     No family history on file.  History  Substance Use Topics  . Smoking status: Current Everyday Smoker  . Smokeless tobacco: Not on file  . Alcohol Use: No      Review of Systems A complete 10 system review of systems was obtained and all systems are negative except as noted in the HPI and PMH.   Allergies  Review of patient's allergies indicates no known allergies.  Home Medications   Current Outpatient Rx  Name Route Sig Dispense Refill  . NAPROXEN 500 MG PO TABS Oral Take 500 mg by mouth 2 (two) times daily with a meal.    . OXYCODONE-ACETAMINOPHEN 10-325 MG PO TABS Oral Take 1 tablet by mouth every 4 (four) hours as needed.    Marland Kitchen TIZANIDINE HCL 4 MG  PO TABS Oral Take 4 mg by mouth every 8 (eight) hours as needed.      BP 119/68  Pulse 70  Temp 98.1 F (36.7 C) (Oral)  Resp 17  Ht 6\' 3"  (1.905 m)  Wt 230 lb (104.327 kg)  BMI 28.75 kg/m2  SpO2 97%  Physical Exam  Nursing note and vitals reviewed. Constitutional: He is oriented to person, place, and time. He appears well-developed and well-nourished. No distress.  HENT:  Head: Normocephalic and atraumatic.  Eyes: EOM are normal. Pupils are equal, round, and reactive to light.  Neck: Neck supple. No tracheal deviation present.  Cardiovascular: Normal rate.   Pulmonary/Chest: Effort normal. No respiratory distress.  Abdominal: Soft. He exhibits no distension.  Musculoskeletal: Normal range of motion. He exhibits no edema.  Neurological: He is alert and oriented to person, place, and time. No sensory deficit.  Skin: Skin is warm and dry.  Psychiatric: His speech is normal and behavior is normal. He exhibits a depressed mood. He expresses suicidal ideation. He expresses no homicidal ideation. He expresses suicidal plans. He expresses no homicidal plans.    ED Course  Procedures (including critical care time)  DIAGNOSTIC STUDIES: Oxygen Saturation is 97% on room air, normal by my interpretation.    COORDINATION OF CARE: 3:46PM- Patient informed of current plan for treatment and evaluation and agrees  with plan at this time.     Labs Reviewed  CBC - Abnormal; Notable for the following:    WBC 12.0 (*)     All other components within normal limits  DIFFERENTIAL - Abnormal; Notable for the following:    Neutro Abs 8.9 (*)     All other components within normal limits  COMPREHENSIVE METABOLIC PANEL - Abnormal; Notable for the following:    GFR calc non Af Amer 71 (*)     GFR calc Af Amer 82 (*)     All other components within normal limits  CARBOXYHEMOGLOBIN - Abnormal; Notable for the following:    Carboxyhemoglobin 4.7 (*)     All other components within normal limits    BLOOD GAS, ARTERIAL - Abnormal; Notable for the following:    Bicarbonate 24.6 (*)     All other components within normal limits  CARBOXYHEMOGLOBIN - Abnormal; Notable for the following:    Carboxyhemoglobin 2.6 (*)     All other components within normal limits  ETHANOL  CARDIAC PANEL(CRET KIN+CKTOT+MB+TROPI)  URINE RAPID DRUG SCREEN (HOSP PERFORMED)   No results found.   1. Suicidal ideation   2. Carbon monoxide poisoning       MDM  Suicidal ideation with plan. Suicide attempt with heart size and sensation. Patient is awake and alert. No headache, nausea, vomiting, chest pain, shortness of breath or abdominal pain. Cocaine use 2 days ago  Carboxyhemoglobin elevated at 4.7%. Patient has a normal pH and is asymptomatic. There is no indication for hyperbarics. Continue 100% oxygen.  After one hour on 100 percent oxygen. Patient's carboxyhemoglobin decreased to 2.6. In a smoker, this is within normal range.  Patient remains asymptomatic. Clear for psychiatric evaluation.   Date: 01/19/2012  Rate: 47  Rhythm: normal sinus rhythm  QRS Axis: normal  Intervals: normal  ST/T Wave abnormalities: normal  Conduction Disutrbances:right bundle branch block  Narrative Interpretation:   Old EKG Reviewed: unchanged    I personally performed the services described in this documentation, which was scribed in my presence.  The recorded information has been reviewed and considered.    Glynn Octave, MD 01/19/12 [place]  Glynn Octave, MD 01/19/12 2119

## 2012-01-19 NOTE — ED Notes (Signed)
Provided dinner tray

## 2012-01-20 ENCOUNTER — Inpatient Hospital Stay (HOSPITAL_COMMUNITY)
Admission: AD | Admit: 2012-01-20 | Discharge: 2012-01-23 | DRG: 897 | Disposition: A | Discharge status: Home / Self Care | Payer: Medicare Other | Source: Ambulatory Visit | Attending: Psychiatry | Admitting: Psychiatry

## 2012-01-20 DIAGNOSIS — F1420 Cocaine dependence, uncomplicated: Secondary | ICD-10-CM | POA: Diagnosis present

## 2012-01-20 DIAGNOSIS — T50904A Poisoning by unspecified drugs, medicaments and biological substances, undetermined, initial encounter: Secondary | ICD-10-CM

## 2012-01-20 DIAGNOSIS — F17200 Nicotine dependence, unspecified, uncomplicated: Secondary | ICD-10-CM | POA: Diagnosis present

## 2012-01-20 DIAGNOSIS — F1994 Other psychoactive substance use, unspecified with psychoactive substance-induced mood disorder: Principal | ICD-10-CM | POA: Diagnosis present

## 2012-01-20 DIAGNOSIS — X838XXA Intentional self-harm by other specified means, initial encounter: Secondary | ICD-10-CM

## 2012-01-20 DIAGNOSIS — T1491XA Suicide attempt, initial encounter: Secondary | ICD-10-CM | POA: Diagnosis present

## 2012-01-20 DIAGNOSIS — R45851 Suicidal ideations: Secondary | ICD-10-CM

## 2012-01-20 DIAGNOSIS — Z79899 Other long term (current) drug therapy: Secondary | ICD-10-CM

## 2012-01-20 MED ORDER — ACETAMINOPHEN 325 MG PO TABS: 650.0000 mg | ORAL_TABLET | Freq: Four times a day (QID) | ORAL | Status: DC | PRN

## 2012-01-20 MED ORDER — MELOXICAM 7.5 MG PO TABS
7.5000 mg | ORAL_TABLET | Freq: Every morning | ORAL | Status: DC
  Administered 2012-01-20 – 2012-01-23 (×4): 7.5 mg via ORAL
  Filled 2012-01-20 (×6): qty 1

## 2012-01-20 MED ORDER — AMITRIPTYLINE HCL 25 MG PO TABS
25.0000 mg | ORAL_TABLET | Freq: Every day | ORAL | Status: DC
  Administered 2012-01-20 – 2012-01-22 (×3): 25 mg via ORAL
  Filled 2012-01-20 (×4): qty 1

## 2012-01-20 MED ORDER — MAGNESIUM HYDROXIDE 400 MG/5ML PO SUSP: 30.0000 mL | Freq: Every day | ORAL | Status: DC | PRN

## 2012-01-20 MED ORDER — DICLOFENAC SODIUM 1 % TD GEL
Freq: Two times a day (BID) | TRANSDERMAL | Status: AC
  Administered 2012-01-20 (×2): via TOPICAL
  Filled 2012-01-20: qty 100

## 2012-01-20 MED ORDER — LIDOCAINE 5 % EX PTCH
1.0000 | MEDICATED_PATCH | CUTANEOUS | Status: DC
  Administered 2012-01-20 – 2012-01-22 (×3): 1 via TRANSDERMAL
  Filled 2012-01-20 (×4): qty 1

## 2012-01-20 MED ORDER — NICOTINE 21 MG/24HR TD PT24
21.0000 mg | MEDICATED_PATCH | Freq: Every day | TRANSDERMAL | Status: DC
  Administered 2012-01-20 – 2012-01-23 (×4): 21 mg via TRANSDERMAL
  Filled 2012-01-20 (×6): qty 1

## 2012-01-20 MED ORDER — NAPROXEN 500 MG PO TABS: 500.0000 mg | ORAL_TABLET | Freq: Two times a day (BID) | ORAL | Status: DC

## 2012-01-20 MED ORDER — ALUM & MAG HYDROXIDE-SIMETH 200-200-20 MG/5ML PO SUSP: 30.0000 mL | ORAL | Status: DC | PRN

## 2012-01-20 NOTE — Progress Notes (Signed)
Patient seen during d/c planning group.  He reports being increasing depressed and attempted suicide.  He currently denies SI/HI and rates all symptoms at one.  Patient reports his depression is a result of wife, of thirty years, being in jail.  H reports that over the past month he has been a cocaine binges and that also lead to the suicide attempt.  He lives with his sister in law in [place] and plans to return there at discharge.  He is not interested in any long term treatment.

## 2012-01-20 NOTE — Tx Team (Signed)
Interdisciplinary Treatment Plan Update (Adult)  Date:  01/20/2012  Time Reviewed:  10:54 AM   Progress in Treatment: Attending groups:   Yes   Participating in groups:  Yes Taking medication as prescribed:  Yes Tolerating medication:  Yes Family/Significant othe contact made:  Patient understands diagnosis:  Yes Discussing patient identified problems/goals with staff: Yes Medical problems stabilized or resolved: Yes Denies suicidal/homicidal ideation:Yes Issues/concerns per patient self-inventory:  Other:  New problem(s) identified:  Reason for Continuation of Hospitalization: Anxiety Depression Medication stabilization  Interventions implemented related to continuation of hospitalization:  Medication Management; safety checks q 15 mins  Additional comments:  Estimated length of stay: 3-5 days  Discharge Plan:  Home with follow up at [facility]):  Review of initial/current patient goals per problem list:    1.  Goal(s): Eliminate SI/other thoughts of self harm   Met:  Yes  Target date: d/c  As evidenced by: Patient no longer endorsse SI/other thought self harm    2.  Goal (s): Reduce depression/anxiety (currently rates both at one)   Met:  Yes  Target date: d/c  As evidenced by: Patient currently rating symptoms at four or below    3.  Goal(s): .stabilize on meds   Met:  No  Target date: d/c  As evidenced by: Patient will report being stable on medications - symptoms have decreased    4.  Goal(s):  Refer for outpatient follow up  Met:  No  Target date: d/c  As evidenced by: .Outpatient follow up will be scheduled  Attendees: Patient:     Nursing:  Omelia Blackwater, RN 01/20/2012 10:55 AM  Physician:  Orson Aloe, MD 01/20/2012 10:54 AM   Nursing:   Berneice Heinrich, RN 01/20/2012 10:54 AM   CaseManager:  Juline Patch, LCSW 01/20/2012 10:54 AM   Counselor:  Angus Palms, LCSW 01/20/2012 10:54 AM

## 2012-01-20 NOTE — Tx Team (Addendum)
Initial Interdisciplinary Treatment Plan  PATIENT STRENGTHS: (choose at least two) Average or above average intelligence Capable of independent living Communication skills Supportive family/friends  PATIENT STRESSORS: Health problems Substance abuse Wife incarcerated   PROBLEM LIST: Problem List/Patient Goals Date to be addressed Date deferred Reason deferred Estimated date of resolution  S/P Suicide attempt 01/20/2012     Cocaine use 01/20/2012     Depressed mood 01/20/2012                                          DISCHARGE CRITERIA:  Ability to meet basic life and health needs Improved stabilization in mood, thinking, and/or behavior Need for constant or close observation no longer present Reduction of life-threatening or endangering symptoms to within safe limits Verbal commitment to aftercare and medication compliance  PRELIMINARY DISCHARGE PLAN: Attend 12-step recovery group Return to previous living arrangement  PATIENT/FAMIILY INVOLVEMENT: This treatment plan has been presented to and reviewed with the patient, Frank Mills.  The patient and family have been given the opportunity to ask questions and make suggestions.  Merian Capron [facility] 01/20/2012, 3:10 AM

## 2012-01-20 NOTE — [facility] Suicide Risk Assessment (Signed)
Suicide Risk Assessment  Admission Assessment     Demographic factors:  Assessment Details Time of Assessment: Admission Information Obtained From: Patient Current Mental Status:  Current Mental Status: Suicidal ideation indicated by patient;Suicide plan;Self-harm behaviors;Intention to act on suicide plan;Belief that plan would result in death Loss Factors:  Loss Factors:  (wife incarcerated) Historical Factors:    Risk Reduction Factors:  Risk Reduction Factors: Living with another person, especially a relative;Positive social support;Sense of responsibility to family;Positive therapeutic relationship  CLINICAL FACTORS:   Depression:   Anhedonia Comorbid alcohol abuse/dependence Impulsivity Alcohol/Substance Abuse/Dependencies Previous Psychiatric Diagnoses and Treatments  COGNITIVE FEATURES THAT CONTRIBUTE TO RISK:  Closed-mindedness Thought constriction (tunnel vision)    SUICIDE RISK:   Moderate:  Frequent suicidal ideation with limited intensity, and duration, some specificity in terms of plans, no associated intent, good self-control, limited dysphoria/symptomatology, some risk factors present, and identifiable protective factors, including available and accessible social support.  Reason for hospitalization: . Diagnosis:   Axis I: Substance Induced Mood Disorder and Cocaine Dependence as well as Suicide Atempt Axis II: Deferred Axis III:  Past Medical History Diagnosis Date . Back pain  . Depression  . Suicidal ideation  . Anxiety  . Panic    ADL's:  Intact  Sleep: Fair  Appetite:  Fair  Suicidal Ideation:  Denies any suicidal thoughts. Homicidal Ideation:  Denies adamantly any homicidal thoughts.  Mental Status Examination/Evaluation: Objective:  Appearance: Casual Eye Contact::  Good Speech:  Clear and Coherent Volume:  Normal Mood:  Depressed Affect:  Congruent Thought Process:  Coherent Orientation:  Full Thought Content:  WDL Suicidal  Thoughts:  No Homicidal Thoughts:  No Memory:  Immediate;   Fair Judgement:  Impaired Insight:  Lacking Psychomotor Activity:  Normal Concentration:  Fair Recall:  Fair Akathisia:  No Handed:  Right AIMS (if indicated):    Assets:  Communication Skills Desire for Improvement Sleep:  Number of Hours: 2.75   Vital Signs:Blood pressure 108/80, pulse 73, temperature 98.4 F (36.9 C), temperature source Oral, resp. rate 18, height 6\' 3"  (1.905 m), weight 93.441 kg (206 lb). Current Medications: Current Facility-Administered Medications Medication Dose Route Frequency Provider Last Rate Last Dose . acetaminophen (TYLENOL) tablet 650 mg  650 mg Oral Q6H PRN Mike Craze, MD     . alum & mag hydroxide-simeth (MAALOX/MYLANTA) 200-200-20 MG/5ML suspension 30 mL  30 mL Oral Q4H PRN Mike Craze, MD     . amitriptyline (ELAVIL) tablet 25 mg  25 mg Oral QHS Mike Craze, MD     . diclofenac sodium (VOLTAREN) 1 % transdermal gel   Topical BID Mike Craze, MD     . lidocaine (LIDODERM) 5 % 1 patch  1 patch Transdermal Q24H Mike Craze, MD   1 patch at 01/20/12 1202 . magnesium hydroxide (MILK OF MAGNESIA) suspension 30 mL  30 mL Oral Daily PRN Mike Craze, MD     . meloxicam [facility]) tablet 7.5 mg  7.5 mg Oral q morning - 10a Mike Craze, MD   7.5 mg at 01/20/12 1202 . nicotine (NICODERM CQ - dosed in mg/24 hours) patch 21 mg  21 mg Transdermal Q0600 Mike Craze, MD   21 mg at 01/20/12 1610 . DISCONTD: naproxen (NAPROSYN) tablet 500 mg  500 mg Oral BID WC Sanjuana Kava, NP      Facility-Administered Medications Ordered in Other Encounters Medication Dose Route Frequency Provider Last Rate Last Dose . DISCONTD: acetaminophen (TYLENOL) tablet 650  mg  650 mg Oral Q4H PRN Glynn Octave, MD     . DISCONTD: LORazepam (ATIVAN) tablet 1 mg  1 mg Oral Q8H PRN Glynn Octave, MD     . DISCONTD: nicotine (NICODERM CQ - dosed in mg/24 hours) patch 21 mg  21 mg Transdermal Daily Glynn Octave, MD     . DISCONTD: ondansetron [facility]) tablet 4 mg  4 mg Oral Q8H PRN Glynn Octave, MD       Lab Results:  Results for orders placed during the hospital encounter of 01/19/12 (from the past 48 hour(s)) CBC     Status: Abnormal  Collection Time  01/19/12  3:34 PM     Component Value Range Comment  WBC 12.0 (*) 4.0 - 10.5 K/uL   RBC 4.73  4.22 - 5.81 MIL/uL   Hemoglobin 15.0  13.0 - 17.0 g/dL   HCT 16.1  09.6 - 04.5 %   MCV 94.3  78.0 - 100.0 fL   MCH 31.7  26.0 - 34.0 pg   MCHC 33.6  30.0 - 36.0 g/dL   RDW 40.9  81.1 - 91.4 %   Platelets 240  150 - 400 K/uL  DIFFERENTIAL     Status: Abnormal  Collection Time  01/19/12  3:34 PM     Component Value Range Comment  Neutrophils Relative 74  43 - 77 %   Neutro Abs 8.9 (*) 1.7 - 7.7 K/uL   Lymphocytes Relative 16  12 - 46 %   Lymphs Abs 1.9  0.7 - 4.0 K/uL   Monocytes Relative 5  3 - 12 %   Monocytes Absolute 0.6  0.1 - 1.0 K/uL   Eosinophils Relative 4  0 - 5 %   Eosinophils Absolute 0.5  0.0 - 0.7 K/uL   Basophils Relative 1  0 - 1 %   Basophils Absolute 0.1  0.0 - 0.1 K/uL  COMPREHENSIVE METABOLIC PANEL     Status: Abnormal  Collection Time  01/19/12  3:34 PM     Component Value Range Comment  Sodium 139  135 - 145 mEq/L   Potassium 3.7  3.5 - 5.1 mEq/L   Chloride 104  96 - 112 mEq/L   CO2 24  19 - 32 mEq/L   Glucose, Bld 99  70 - 99 mg/dL   BUN 10  6 - 23 mg/dL   Creatinine, Ser 7.82  0.50 - 1.35 mg/dL   Calcium 9.4  8.4 - 95.6 mg/dL   Total Protein 7.0  6.0 - 8.3 g/dL   Albumin 3.6  3.5 - 5.2 g/dL   AST 21  0 - 37 U/L   ALT 26  0 - 53 U/L   Alkaline Phosphatase 43  39 - 117 U/L   Total Bilirubin 0.4  0.3 - 1.2 mg/dL   GFR calc non Af Amer 71 (*) >90 mL/min   GFR calc Af Amer 82 (*) >90 mL/min  ETHANOL     Status: Normal  Collection Time  01/19/12  3:34 PM     Component Value Range Comment  Alcohol, Ethyl (B) <11  0 - 11 mg/dL  CARDIAC PANEL(CRET KIN+CKTOT+MB+TROPI)     Status: Normal  Collection  Time  01/19/12  3:34 PM     Component Value Range Comment  Total CK 96  7 - 232 U/L   CK, MB 1.7  0.3 - 4.0 ng/mL   Troponin I <0.30  <0.30 ng/mL   Relative Index RELATIVE INDEX IS INVALID  0.0 - 2.5  CARBOXYHEMOGLOBIN     Status: Abnormal  Collection Time  01/19/12  4:15 PM     Component Value Range Comment  Total hemoglobin 14.4  13.5 - 18.0 g/dL   O2 Saturation 16.1     Carboxyhemoglobin 4.7 (*) 0.5 - 1.5 %   Methemoglobin 1.0  0.0 - 1.5 %   Total oxygen content 18.7  15.0 - 23.0 mL/dL  BLOOD GAS, ARTERIAL     Status: Abnormal  Collection Time  01/19/12  4:15 PM     Component Value Range Comment  FIO2 21.00     pH, Arterial 7.418  7.350 - 7.450   pCO2 arterial 38.8  35.0 - 45.0 mmHg   pO2, Arterial 96.8  80.0 - 100.0 mmHg   Bicarbonate 24.6 (*) 20.0 - 24.0 mEq/L   TCO2 21.4  0 - 100 mmol/L   Acid-Base Excess 0.6  0.0 - 2.0 mmol/L   O2 Saturation 97.7     Collection site LEFT BRACHIAL     Drawn by COLLECTED BY RT     Sample type ARTERIAL     Allens test (pass/fail) PASS  PASS  CARBOXYHEMOGLOBIN     Status: Abnormal  Collection Time  01/19/12  6:10 PM     Component Value Range Comment  Total hemoglobin 14.7  13.5 - 18.0 g/dL   O2 Saturation 09.6     Carboxyhemoglobin 2.6 (*) 0.5 - 1.5 %   Methemoglobin 1.1  0.0 - 1.5 %   Total oxygen content 20.7  15.0 - 23.0 mL/dL  URINE RAPID DRUG SCREEN (HOSP PERFORMED)     Status: Abnormal  Collection Time  01/19/12  8:14 PM     Component Value Range Comment  Opiates NONE DETECTED  NONE DETECTED   Cocaine POSITIVE (*) NONE DETECTED   Benzodiazepines NONE DETECTED  NONE DETECTED   Amphetamines NONE DETECTED  NONE DETECTED   Tetrahydrocannabinol NONE DETECTED  NONE DETECTED   Barbiturates NONE DETECTED  NONE DETECTED    Physical Findings: AIMS:  , ,  ,  ,    CIWA:    COWS:     Risk of self harm is elevated by his mood disorder, his pain, and his addictions.  Risk of harm to others is minimal in that he has not been involved  in fights or had any legal charges filed on him.  Treatment Plan Summary: Daily contact with patient to assess and evaluate symptoms and progress in treatment Medication management  Plan: Admit, treat pain with Elavil, Voltaren, then Lidoderm and Mobic. Refer to residential.  Discussed the risks, benefits, and probable clinical course with and without treatment.  Pt is agreeable to the current course of treatment. Could consider D/C on Sunday. We will continue on q. 15 checks the unit protocol. At this time there is no clinical indication for one-to-one observation as patient contract for safety and presents little risk to harm themself and others.  We will increase collateral information. I encourage patient to participate in group milieu therapy. Pt will be seen in treatment team soon for further treatment and appropriate discharge planning. Please see history and physical note for more detailed information ELOS: 3 to 5 days.   Sommer Spickard 01/20/2012, 8:19 PM

## 2012-01-20 NOTE — Progress Notes (Signed)
D: Pt denies SI/HI/AV. Pt is pleasant and cooperative. Pt rates depression at a 1 and Helplessness/hopelessness at a 1. Pt feel she does not need to be here and wants to go home.  A: Pt was offered support and encouragement. Pt was given medications. Pt was encourage to attend groups. Q 15 minute checks were done for safety. Pt was brought into treatment team but will not be leaving today.  R: Pt attends groups and interacts well with peers and staff. Pt is taking medication. Pt has no complaints but feels he does not need to be here. Pt receptive to treatment and safety maintained on unit.

## 2012-01-20 NOTE — Progress Notes (Signed)
[facility] Group Notes: (Counselor/Nursing/MHT/Case Management/Adjunct) 01/20/2012   @11 :00am Preventing Relapse  Type of Therapy:  Group Therapy  Participation Level:  Limited  Participation Quality: Attentive, Sharing    Affect:  Blunted  Cognitive:  Appropriate  Insight:  Limited  Engagement in Group: Limited  Engagement in Therapy: Limited    Modes of Intervention:  Support and Exploration  Summary of Progress/Problems: Gilverto spoke briefly about having the power to make his own decisions, and noted that his biggest trigger for relapse is being around the wrong people - his using buddies. He shared that sometimes people telling him he cannot or will not do something gives him motivation to do it, so he finds empowerment through himself in that way.  Billie Lade 01/20/2012 1:59 PM

## 2012-01-20 NOTE — H&P (Signed)
Psychiatric Admission Assessment Adult  Patient Identification:  [place]  Date of Evaluation:  01/20/2012  Chief Complaint:  MDD  History of Present Illness: "This is a 51 year old Caucasian male, admitted to [facility] from the [facility] ED with complaints of suicide attempt. Patient reports, "I attempted suicide yesterday. I tried to poison myself with carbon monoxide. I put a hose on the tail pipe of my car.  Then I slid the other end of the hose inside the car and shut the car door. I went inside of the car, started the car and waited to see what happens. I sat inside the car, waited for an hour and nothing happened. I got out of the car, took off the hose from the tail pipe of the car. I then went out got something to eat, watched TV. Later on, I sent a text to my brother-inlaw telling him what I had done. What happened was, I went on a cocaine binge x 2 weeks,  Then suicide pooped up in my head, I decided to do it and I tried to do it too. It was weird because I have never attempted to do something like this prior to this. I have not even thought about suicide. I think the cocaine interacted with the new medicine that I was taking for my pain. It is called Zanaflex. I don't want to take that medicine again. I used to do a $1,000.00 worth of cocaine everyday, take my Percocet pills and yet nothing happens. This kind of thoughts was not there. After my brother-inlaw got my text messages, he called the sheriff who came and took me to the hospital.  I am not depressed any more"  Mood Symptoms:  Past 2 Weeks, Sadness, SI,  Depression Symptoms:  suicidal attempt,  (Hypo) Manic Symptoms:  Impulsivity,  Anxiety Symptoms:  None  Psychotic Symptoms:  Hallucinations: None  PTSD Symptoms: Had a traumatic exposure:  None  Past Psychiatric History: Diagnosis:Psychoactive substance-induced organic mood disorder, Cocaine abuse   Hospitalizations: [facility]  Outpatient Care: Dr. Clelia Croft    Substance Abuse Care: None reported  Self-Mutilation: Denies  Suicidal Attempts: "Yes, by carbon monoxide"  Violent Behaviors: None reported   Past Medical History:   Past Medical History  Diagnosis Date  . Back pain   . Depression   . Suicidal ideation   . Anxiety   . Panic     Allergies:  No Known Allergies  PTA Medications: Prescriptions prior to admission  Medication Sig Dispense Refill  . naproxen (NAPROSYN) 500 MG tablet Take 500 mg by mouth 2 (two) times daily with a meal.      . oxyCODONE-acetaminophen (PERCOCET) 10-325 MG per tablet Take 1 tablet by mouth every 4 (four) hours as needed.      Marland Kitchen tiZANidine (ZANAFLEX) 4 MG tablet Take 4 mg by mouth every 8 (eight) hours as needed.          Substance Abuse History in the last 12 months: Substance Age of 1st Use Last Use Amount Specific Type  Nicotine 14 Prior to hosp 1 pack daily Cigarettes  Alcohol 17 "I drink just once a month" 1 pint of liquor Liquor  Cannabis Denies use     Opiates Denies use     Cocaine 37 "I use twice a month"  Cocaine  Methamphetamines Denies use     LSD Denies use     Ecstasy Denies use     Benzodiazepines Denies use  Caffeine      Inhalants      Others:                         Consequences of Substance Abuse: Medical Consequences:  Liver damage Legal Consequences:  Arrests, jail time Family Consequences:  Family discord  Social History: Current Place of Residence:  [place]  Place of Birth:  [place]  Family Members: My wife"  Marital Status:  Married  Children:1  Sons:1  Daughters:0  Relationships: "I'm married"  Education:  No high school diploma  Educational Problems/Performance: "I did not finish high school"  Religious Beliefs/Practices: None reported  History of Abuse (Emotional/Phsycial/Sexual): Denies  Occupational Experiences: Disabled  Military History:  None.  Legal History: None reported  Hobbies/Interests: None reported  Family History:  No  family history on file.  Mental Status Examination/Evaluation: Objective:  Appearance: Casual  Eye Contact::  Good  Speech:  Clear and Coherent  Volume:  Normal  Mood:  Euthymic  Affect:  Appropriate  Thought Process:  Coherent  Orientation:  Full  Thought Content:  Rumination  Suicidal Thoughts:  No  Homicidal Thoughts:  No  Memory:  Immediate;   Good Recent;   Good Remote;   Good  Judgement:  Poor  Insight:  Lacking  Psychomotor Activity:  Normal  Concentration:  Good  Recall:  Good  Akathisia:  No  Handed:  Right  AIMS (if indicated):     Assets:  Desire for Improvement  Sleep:  Number of Hours: 2.75     Laboratory/X-Ray: None Psychological Evaluation(s)      Assessment:    AXIS I:  Psychoactive substance-induced organic mood disorder, Cocaine dependence AXIS II:  Deferred AXIS III:   Past Medical History  Diagnosis Date  . Back pain   . Depression   . Suicidal ideation   . Anxiety   . Panic    AXIS IV:  other psychosocial or environmental problems and Substance abuse and dependency AXIS V:  11-20 some danger of hurting self or others possible OR occasionally fails to maintain minimal personal hygiene OR gross impairment in communication  Treatment Plan/Recommendations: Admit for safety and stabilization. Review and reinstate any pertinent home medications for safety. Initiate nicotine patch for nicotine withdrawal symptoms, Amitriptyline 25 mg Q bedtime for sleep. Mobic 7.5 mg daily for arthritic pain. Continue current treatment plan.  Treatment Plan Summary: Daily contact with patient to assess and evaluate symptoms and progress in treatment Medication management  Current Medications:  Current Facility-Administered Medications  Medication Dose Route Frequency Provider Last Rate Last Dose  . acetaminophen (TYLENOL) tablet 650 mg  650 mg Oral Q6H PRN Mike Craze, MD      . alum & mag hydroxide-simeth (MAALOX/MYLANTA) 200-200-20 MG/5ML suspension 30  mL  30 mL Oral Q4H PRN Mike Craze, MD      . amitriptyline (ELAVIL) tablet 25 mg  25 mg Oral QHS Mike Craze, MD      . diclofenac sodium (VOLTAREN) 1 % transdermal gel   Topical BID Mike Craze, MD      . lidocaine (LIDODERM) 5 % 1 patch  1 patch Transdermal Q24H Mike Craze, MD   1 patch at 01/20/12 1202  . magnesium hydroxide (MILK OF MAGNESIA) suspension 30 mL  30 mL Oral Daily PRN Mike Craze, MD      . meloxicam [facility]) tablet 7.5 mg  7.5 mg Oral q morning - 10a Dorma Russell  Tawnya Crook, MD   7.5 mg at 01/20/12 1202  . nicotine (NICODERM CQ - dosed in mg/24 hours) patch 21 mg  21 mg Transdermal Q0600 Mike Craze, MD   21 mg at 01/20/12 1610  . DISCONTD: naproxen (NAPROSYN) tablet 500 mg  500 mg Oral BID WC Sanjuana Kava, NP       Facility-Administered Medications Ordered in Other Encounters  Medication Dose Route Frequency Provider Last Rate Last Dose  . DISCONTD: acetaminophen (TYLENOL) tablet 650 mg  650 mg Oral Q4H PRN Glynn Octave, MD      . DISCONTD: LORazepam (ATIVAN) tablet 1 mg  1 mg Oral Q8H PRN Glynn Octave, MD      . DISCONTD: nicotine (NICODERM CQ - dosed in mg/24 hours) patch 21 mg  21 mg Transdermal Daily Glynn Octave, MD      . DISCONTD: ondansetron (ZOFRAN) tablet 4 mg  4 mg Oral Q8H PRN Glynn Octave, MD        Observation Level/Precautions:  Q 15 minutes checks for safety  Laboratory:  Reviewed ED lab findings on file.  Psychotherapy: Group   Medications:  See medication lists  Routine PRN Medications:  Yes  Consultations: None indicated at this time   Discharge Concerns: Safety  Other:     Armandina Stammer I 6/21/20139:28 PM

## 2012-01-20 NOTE — Progress Notes (Addendum)
[facility] MD Progress Note  01/20/2012 5:21 PM  Diagnosis:   Axis I: Substance Induced Mood Disorder and Cocaine Dependence as well as Suicide Atempt Axis II: Deferred Axis III:  Past Medical History  Diagnosis Date  . Back pain   . Depression   . Suicidal ideation   . Anxiety   . Panic     ADL's:  Intact  Sleep: Fair  Appetite:  Fair  Suicidal Ideation:  Denies any suicidal thoughts. Homicidal Ideation:  Denies adamantly any homicidal thoughts.  Mental Status Examination/Evaluation: Objective:  Appearance: Casual  Eye Contact::  Good  Speech:  Clear and Coherent  Volume:  Normal  Mood:  Depressed  Affect:  Congruent  Thought Process:  Coherent  Orientation:  Full  Thought Content:  WDL  Suicidal Thoughts:  No  Homicidal Thoughts:  No  Memory:  Immediate;   Fair  Judgement:  Impaired  Insight:  Lacking  Psychomotor Activity:  Normal  Concentration:  Fair  Recall:  Fair  Akathisia:  No  Handed:  Right  AIMS (if indicated):     Assets:  Communication Skills Desire for Improvement  Sleep:  Number of Hours: 2.75    Vital Signs:Blood pressure 108/80, pulse 73, temperature 98.4 F (36.9 C), temperature source Oral, resp. rate 18, height 6\' 3"  (1.905 m), weight 93.441 kg (206 lb). Current Medications: Current Facility-Administered Medications  Medication Dose Route Frequency Provider Last Rate Last Dose  . acetaminophen (TYLENOL) tablet 650 mg  650 mg Oral Q6H PRN Mike Craze, MD      . alum & mag hydroxide-simeth (MAALOX/MYLANTA) 200-200-20 MG/5ML suspension 30 mL  30 mL Oral Q4H PRN Mike Craze, MD      . amitriptyline (ELAVIL) tablet 25 mg  25 mg Oral QHS Mike Craze, MD      . diclofenac sodium (VOLTAREN) 1 % transdermal gel   Topical BID Mike Craze, MD      . lidocaine (LIDODERM) 5 % 1 patch  1 patch Transdermal Q24H Mike Craze, MD   1 patch at 01/20/12 1202  . magnesium hydroxide (MILK OF MAGNESIA) suspension 30 mL  30 mL Oral Daily PRN Mike Craze, MD      . meloxicam [facility]) tablet 7.5 mg  7.5 mg Oral q morning - 10a Mike Craze, MD   7.5 mg at 01/20/12 1202  . nicotine (NICODERM CQ - dosed in mg/24 hours) patch 21 mg  21 mg Transdermal Q0600 Mike Craze, MD   21 mg at 01/20/12 5409  . DISCONTD: naproxen (NAPROSYN) tablet 500 mg  500 mg Oral BID WC Sanjuana Kava, NP       Facility-Administered Medications Ordered in Other Encounters  Medication Dose Route Frequency Provider Last Rate Last Dose  . DISCONTD: acetaminophen (TYLENOL) tablet 650 mg  650 mg Oral Q4H PRN Glynn Octave, MD      . DISCONTD: LORazepam (ATIVAN) tablet 1 mg  1 mg Oral Q8H PRN Glynn Octave, MD      . DISCONTD: nicotine (NICODERM CQ - dosed in mg/24 hours) patch 21 mg  21 mg Transdermal Daily Glynn Octave, MD      . DISCONTD: ondansetron (ZOFRAN) tablet 4 mg  4 mg Oral Q8H PRN Glynn Octave, MD        Lab Results:  Results for orders placed during the hospital encounter of 01/19/12 (from the past 48 hour(s))  CBC     Status: Abnormal   Collection  Time   01/19/12  3:34 PM      Component Value Range Comment   WBC 12.0 (*) 4.0 - 10.5 K/uL    RBC 4.73  4.22 - 5.81 MIL/uL    Hemoglobin 15.0  13.0 - 17.0 g/dL    HCT 40.9  81.1 - 91.4 %    MCV 94.3  78.0 - 100.0 fL    MCH 31.7  26.0 - 34.0 pg    MCHC 33.6  30.0 - 36.0 g/dL    RDW 78.2  95.6 - 21.3 %    Platelets 240  150 - 400 K/uL   DIFFERENTIAL     Status: Abnormal   Collection Time   01/19/12  3:34 PM      Component Value Range Comment   Neutrophils Relative 74  43 - 77 %    Neutro Abs 8.9 (*) 1.7 - 7.7 K/uL    Lymphocytes Relative 16  12 - 46 %    Lymphs Abs 1.9  0.7 - 4.0 K/uL    Monocytes Relative 5  3 - 12 %    Monocytes Absolute 0.6  0.1 - 1.0 K/uL    Eosinophils Relative 4  0 - 5 %    Eosinophils Absolute 0.5  0.0 - 0.7 K/uL    Basophils Relative 1  0 - 1 %    Basophils Absolute 0.1  0.0 - 0.1 K/uL   COMPREHENSIVE METABOLIC PANEL     Status: Abnormal   Collection Time    01/19/12  3:34 PM      Component Value Range Comment   Sodium 139  135 - 145 mEq/L    Potassium 3.7  3.5 - 5.1 mEq/L    Chloride 104  96 - 112 mEq/L    CO2 24  19 - 32 mEq/L    Glucose, Bld 99  70 - 99 mg/dL    BUN 10  6 - 23 mg/dL    Creatinine, Ser 0.86  0.50 - 1.35 mg/dL    Calcium 9.4  8.4 - 57.8 mg/dL    Total Protein 7.0  6.0 - 8.3 g/dL    Albumin 3.6  3.5 - 5.2 g/dL    AST 21  0 - 37 U/L    ALT 26  0 - 53 U/L    Alkaline Phosphatase 43  39 - 117 U/L    Total Bilirubin 0.4  0.3 - 1.2 mg/dL    GFR calc non Af Amer 71 (*) >90 mL/min    GFR calc Af Amer 82 (*) >90 mL/min   ETHANOL     Status: Normal   Collection Time   01/19/12  3:34 PM      Component Value Range Comment   Alcohol, Ethyl (B) <11  0 - 11 mg/dL   CARDIAC PANEL(CRET KIN+CKTOT+MB+TROPI)     Status: Normal   Collection Time   01/19/12  3:34 PM      Component Value Range Comment   Total CK 96  7 - 232 U/L    CK, MB 1.7  0.3 - 4.0 ng/mL    Troponin I <0.30  <0.30 ng/mL    Relative Index RELATIVE INDEX IS INVALID  0.0 - 2.5   CARBOXYHEMOGLOBIN     Status: Abnormal   Collection Time   01/19/12  4:15 PM      Component Value Range Comment   Total hemoglobin 14.4  13.5 - 18.0 g/dL    O2 Saturation 97.7  Carboxyhemoglobin 4.7 (*) 0.5 - 1.5 %    Methemoglobin 1.0  0.0 - 1.5 %    Total oxygen content 18.7  15.0 - 23.0 mL/dL   BLOOD GAS, ARTERIAL     Status: Abnormal   Collection Time   01/19/12  4:15 PM      Component Value Range Comment   FIO2 21.00      pH, Arterial 7.418  7.350 - 7.450    pCO2 arterial 38.8  35.0 - 45.0 mmHg    pO2, Arterial 96.8  80.0 - 100.0 mmHg    Bicarbonate 24.6 (*) 20.0 - 24.0 mEq/L    TCO2 21.4  0 - 100 mmol/L    Acid-Base Excess 0.6  0.0 - 2.0 mmol/L    O2 Saturation 97.7      Collection site LEFT BRACHIAL      Drawn by COLLECTED BY RT      Sample type ARTERIAL      Allens test (pass/fail) PASS  PASS   CARBOXYHEMOGLOBIN     Status: Abnormal   Collection Time   01/19/12  6:10 PM       Component Value Range Comment   Total hemoglobin 14.7  13.5 - 18.0 g/dL    O2 Saturation 16.1      Carboxyhemoglobin 2.6 (*) 0.5 - 1.5 %    Methemoglobin 1.1  0.0 - 1.5 %    Total oxygen content 20.7  15.0 - 23.0 mL/dL   URINE RAPID DRUG SCREEN (HOSP PERFORMED)     Status: Abnormal   Collection Time   01/19/12  8:14 PM      Component Value Range Comment   Opiates NONE DETECTED  NONE DETECTED    Cocaine POSITIVE (*) NONE DETECTED    Benzodiazepines NONE DETECTED  NONE DETECTED    Amphetamines NONE DETECTED  NONE DETECTED    Tetrahydrocannabinol NONE DETECTED  NONE DETECTED    Barbiturates NONE DETECTED  NONE DETECTED     Physical Findings: AIMS:  , ,  ,  ,    CIWA:    COWS:     Treatment Plan Summary: Daily contact with patient to assess and evaluate symptoms and progress in treatment Medication management  Plan: Admit, treat pain with Elavil, Voltaren, then Lidoderm and Mobic. Refer to residential.  Discussed the risks, benefits, and probable clinical course with and without treatment.  Pt is agreeable to the current course of treatment. Could consider D/C on Sunday.  Frank Mills 01/20/2012, 5:21 PM

## 2012-01-20 NOTE — Progress Notes (Signed)
Brief Nutrition Note  Reason: Patient screened positive for nutrition risk for unintentional weight loss > 10 lb over 1 month.   Wt Readings from Last 10 Encounters:  01/20/12 206 lb (93.441 kg)  01/19/12 230 lb (104.327 kg)    Patient reported his appetite and intake are well. He stated PTA, he was not eating due to a "cocaine binge". I encouraged the patient to eat regularly. We briefly discussed the importance of nutrition. Patient was without any nutrition related questions.   RD available for nutrition needs.   Iven Finn [facility] [phone]

## 2012-01-20 NOTE — Progress Notes (Signed)
Pt invol admitted from [facility] after attempting suicide via carbon monoxide poisoning following a crack binge. After an hour without success pt texted brother who phoned law enforcement. Pt minimizes attempt and feels he should have been discharged from the ED. "I have thought about suicide about 10 times but this is my first attempt." Pt believes his cocaine use is interacting with his prescribed zanaflex resulting in suicidality. Pt reports feeling like a failure to his wife and son. Wife presently incarcerated. Pt reports hx of back surgery in 2002 and a hx of MRSA. Pt currently has some scabbed areas bilat on legs though none resemble a boil type presentation. Pt reports more than a 10lbs weight loss within the last month due to his cocaine binges. Provided food and fluids, oriented to unit and level III obs initiated. Pt is blunted in affect, mood depressed. He denies any present SI/HI or AVH. Pain is at a 7/10 but pt refuses tylenol prn. Pt currently resting in bed. Lawrence Marseilles

## 2012-01-21 DIAGNOSIS — F1420 Cocaine dependence, uncomplicated: Secondary | ICD-10-CM

## 2012-01-21 MED ORDER — TRAZODONE HCL 100 MG PO TABS: 100.0000 mg | ORAL_TABLET | Freq: Every day | ORAL | Status: DC

## 2012-01-21 MED ORDER — DIPHENHYDRAMINE HCL 25 MG PO CAPS: 50.0000 mg | ORAL_CAPSULE | Freq: Three times a day (TID) | ORAL | Status: DC | PRN

## 2012-01-21 NOTE — Progress Notes (Signed)
Writer observed patient lying in bed awake. Writer introduced self to patient as his nurse for the shift. Patient reported that he was resting. Writer informed patient of his scheduled medication and reported that he wanted to go ahead and take his  lidoderm patch off now instead of scheduled time for midnight. Patient currently denies having pain, -si/hi/a/v hall. Support and encouragement offered. Safety maintained on unit, will continue to monitor.

## 2012-01-21 NOTE — Progress Notes (Signed)
[facility] MD Progress Note  01/21/2012 4:11 PM  Diagnosis:   Axis I: Substance Abuse and Substance Induced Mood Disorder Axis II: Deferred Axis III:  Past Medical History  Diagnosis Date  . Back pain   . Depression   . Suicidal ideation   . Anxiety   . Panic    Subjective: Frank Mills is lying in bed this afternoon while everyone else when outside. He claims that the Lidoderm patch has caused him to feel tired. When he was asked if he was having any suicidal thoughts, his response was "all on thinking about it is when I can get out of here." He denies any suicidal or homicidal ideation. He denies any auditory or visual hallucinations. He reports that he slept well last night and he has eaten well today. He is adamant that this is a one-time event and that it will never happen again.  ADL's:  Intact  Sleep: Good  Appetite:  Good  Suicidal Ideation:  Patient denies any thought, plan, or intent Homicidal Ideation:  Patient denies any thought, plan, or intent  AEB (as evidenced by):  Mental Status Examination/Evaluation: Objective:  Appearance: Disheveled  Eye Contact::  Fair  Speech:  Garbled  Volume:  Normal  Mood:  Dysphoric  Affect:  Congruent  Thought Process:  Circumstantial  Orientation:  Full  Thought Content:  WDL  Suicidal Thoughts:  No  Homicidal Thoughts:  No  Memory:  Immediate;   Good  Judgement:  Fair  Insight:  Fair  Psychomotor Activity:  Decreased  Concentration:  Good  Recall:  Good  Akathisia:  No  Handed:    AIMS (if indicated):     Assets:    Sleep:  Number of Hours: 6.25    Vital Signs:Blood pressure 108/58, pulse 70, temperature 97.8 F (36.6 C), temperature source Oral, resp. rate 16, height 6\' 3"  (1.905 m), weight 93.441 kg (206 lb). Current Medications: Current Facility-Administered Medications  Medication Dose Route Frequency Provider Last Rate Last Dose  . acetaminophen (TYLENOL) tablet 650 mg  650 mg Oral Q6H PRN Mike Craze, MD      .  alum & mag hydroxide-simeth (MAALOX/MYLANTA) 200-200-20 MG/5ML suspension 30 mL  30 mL Oral Q4H PRN Mike Craze, MD      . amitriptyline (ELAVIL) tablet 25 mg  25 mg Oral QHS Mike Craze, MD   25 mg at 01/20/12 2158  . diclofenac sodium (VOLTAREN) 1 % transdermal gel   Topical BID Mike Craze, MD      . lidocaine (LIDODERM) 5 % 1 patch  1 patch Transdermal Q24H Mike Craze, MD   1 patch at 01/21/12 1205  . magnesium hydroxide (MILK OF MAGNESIA) suspension 30 mL  30 mL Oral Daily PRN Mike Craze, MD      . meloxicam [facility]) tablet 7.5 mg  7.5 mg Oral q morning - 10a Mike Craze, MD   7.5 mg at 01/21/12 0929  . nicotine (NICODERM CQ - dosed in mg/24 hours) patch 21 mg  21 mg Transdermal Q0600 Mike Craze, MD   21 mg at 01/21/12 0630    Lab Results:  Results for orders placed during the hospital encounter of 01/19/12 (from the past 48 hour(s))  CARBOXYHEMOGLOBIN     Status: Abnormal   Collection Time   01/19/12  4:15 PM      Component Value Range Comment   Total hemoglobin 14.4  13.5 - 18.0 g/dL    O2 Saturation  97.7      Carboxyhemoglobin 4.7 (*) 0.5 - 1.5 %    Methemoglobin 1.0  0.0 - 1.5 %    Total oxygen content 18.7  15.0 - 23.0 mL/dL   BLOOD GAS, ARTERIAL     Status: Abnormal   Collection Time   01/19/12  4:15 PM      Component Value Range Comment   FIO2 21.00      pH, Arterial 7.418  7.350 - 7.450    pCO2 arterial 38.8  35.0 - 45.0 mmHg    pO2, Arterial 96.8  80.0 - 100.0 mmHg    Bicarbonate 24.6 (*) 20.0 - 24.0 mEq/L    TCO2 21.4  0 - 100 mmol/L    Acid-Base Excess 0.6  0.0 - 2.0 mmol/L    O2 Saturation 97.7      Collection site LEFT BRACHIAL      Drawn by COLLECTED BY RT      Sample type ARTERIAL      Allens test (pass/fail) PASS  PASS   CARBOXYHEMOGLOBIN     Status: Abnormal   Collection Time   01/19/12  6:10 PM      Component Value Range Comment   Total hemoglobin 14.7  13.5 - 18.0 g/dL    O2 Saturation 16.1      Carboxyhemoglobin 2.6 (*) 0.5 - 1.5  %    Methemoglobin 1.1  0.0 - 1.5 %    Total oxygen content 20.7  15.0 - 23.0 mL/dL   URINE RAPID DRUG SCREEN (HOSP PERFORMED)     Status: Abnormal   Collection Time   01/19/12  8:14 PM      Component Value Range Comment   Opiates NONE DETECTED  NONE DETECTED    Cocaine POSITIVE (*) NONE DETECTED    Benzodiazepines NONE DETECTED  NONE DETECTED    Amphetamines NONE DETECTED  NONE DETECTED    Tetrahydrocannabinol NONE DETECTED  NONE DETECTED    Barbiturates NONE DETECTED  NONE DETECTED     Physical Findings: AIMS:  , ,  ,  ,    CIWA:    COWS:     Treatment Plan Summary: Daily contact with patient to assess and evaluate symptoms and progress in treatment Medication management  Plan: We will continue his current plan of care. Case management will need to make followup plans and appointments prior to discharge. Ashtin Melichar 01/21/2012, 4:11 PM

## 2012-01-21 NOTE — Progress Notes (Signed)
[facility] Group Notes:  (Counselor/Nursing/MHT/Case Management/Adjunct)  01/21/2012 4:17 PM  Type of Therapy:  Group Therapy  Participation Level:  Active  Participation Quality:  Appropriate and Attentive  Affect:  Appropriate  Cognitive:  Appropriate  Insight:  Good  Engagement in Group:  Good  Engagement in Therapy:  Good  Modes of Intervention:  Clarification, Education, Socialization and Support  Summary of Progress/Problems: Pt. participated in group on self sabotaging behaviors and what that word means to the, what they do specifically that is self sabotaging from themselves, and the changes that they can make in order to make the changes that they need to make. Pt. Spoke about his addiction to substance abuse as being his self sabotaging behavior. Pt. Spoke about how people who call themselves his friends come around when he has money and how he falls into substance abuse. The pt. also stated that when he becomes alone that this is also a trigger for his substance abuse. Frank Mills [place] 01/21/2012, 4:17 PM

## 2012-01-21 NOTE — Progress Notes (Signed)
Patient is visible on the unit and interacting with others. Sleeping fair and eating well. Denies SI. Rates depression/hopelessness at 1. Ability to pay attention is improving. Patient wrote on his self inventory regarding goals "Stay sober, stay focused, not use cocaine, start classes, go back to church, stay away from bad friends". Patient is pleasant during interactions. Remains safe on the unit.

## 2012-01-21 NOTE — Progress Notes (Signed)
Met with pt 1:1 who states his day has been good. He continues to focus on discharge and maintains his SI attempt was due to the cocaine and home med zanaflex and was only a "one time thing." Minimal insight. Refused 300 hall AA group but did attend wrap up group on 500 hall. No needs voiced. Denies SI/HI/AVH. Frank Mills

## 2012-01-22 NOTE — Progress Notes (Signed)
[facility] Group Notes:  (Counselor/Nursing/MHT/Case Management/Adjunct)  01/22/2012 6:00 PM  Type of Therapy:  Group Therapy  Participation Level:  Active  Participation Quality:  Appropriate and Attentive  Affect:  Appropriate  Cognitive:  Appropriate  Insight:  Good  Engagement in Group:  Good  Engagement in Therapy:  Good  Modes of Intervention:  Clarification, Education, Socialization and Support  Summary of Progress/Problems: Pt. participated in group on supports and what support means to them, who they have as supports in their lives, and how to seek support if they do not have supports in their life. Pt. Spoke about his son being a support.   Frank Mills [place] 01/22/2012, 6:00 PM

## 2012-01-22 NOTE — Progress Notes (Signed)
[facility] Group Notes:  (Counselor/Nursing/MHT/Case Management/Adjunct)  01/22/2012 5:38 PM  Type of Therapy:  After Care Planning group  Pt.  participated in after care planning group and was given [place] SI information along with crisis  and hotline numbers . Each pt. Agreed to use them if needed. Pt. Pt. Was also given information about the [facility] and a list of free support groups located in [place]. Pt. States he saw Pa and  Doctor. Pt. States he had problems sleeping last night. Pt. Denied SI/HI  Neila Gear 01/22/2012, 5:38 PM

## 2012-01-22 NOTE — Progress Notes (Signed)
Frank Mills's status remains unchanged today. He continues to focus on likely discharge tomorrow. Does not wish to attend 300 hall AA group this evening but did attend 500 wrap up group. No needs voiced. Supported, encouraged. Denies SI/HI/AVH and remains safe. Lawrence Marseilles

## 2012-01-22 NOTE — Progress Notes (Signed)
[facility] MD Progress Note  01/22/2012 11:34 AM  Diagnosis:  Axis I: Substance Induced Mood Disorder and Cocaine abuse versus dependence  ADL's:  Intact  Sleep: Fair  Appetite:  Fair  Suicidal Ideation:  Patient has a suicidal ideation and attempt, while intoxicated but denies today Homicidal Ideation:  Has no homicidal ideation, intentions or plan  AEB (as evidenced by): Patient has been adjusting to the unit and compliant with his medications without adverse effects. Patient doesn't know significant emotional or behavioral problems. On the unit Patient the requested to make followup appointment with Dr. Carroll Sage in [place], [place], where the office close to his home and he is also willing to accept mental [facility] Center give case manager, work with it.  Mental Status Examination/Evaluation: Objective:  Appearance: Casual, Fairly Groomed and Guarded  Eye Contact::  Good  Speech:  Clear and Coherent  Volume:  Normal  Mood:  Anxious and Depressed  Affect:  Appropriate and Congruent  Thought Process:  Coherent and Goal Directed  Orientation:  Full  Thought Content:  WDL  Suicidal Thoughts:  Yes.  without intent/plan  Homicidal Thoughts:  No  Memory:  Immediate;   Fair  Judgement:  Impaired  Insight:  Lacking  Psychomotor Activity:  Normal  Concentration:  Fair  Recall:  Fair  Akathisia:  No  Handed:  Right  AIMS (if indicated):     Assets:  Communication Skills Housing Intimacy Leisure Time Physical Health Resilience Social Support  Sleep:  Number of Hours: 5.5    Vital Signs:Blood pressure 104/64, pulse 80, temperature 98 F (36.7 C), temperature source Oral, resp. rate 16, height 6\' 3"  (1.905 m), weight 206 lb (93.441 kg). Current Medications: Current Facility-Administered Medications  Medication Dose Route Frequency Provider Last Rate Last Dose  . acetaminophen (TYLENOL) tablet 650 mg  650 mg Oral Q6H PRN Mike Craze, MD      . alum & mag hydroxide-simeth  (MAALOX/MYLANTA) 200-200-20 MG/5ML suspension 30 mL  30 mL Oral Q4H PRN Mike Craze, MD      . amitriptyline (ELAVIL) tablet 25 mg  25 mg Oral QHS Mike Craze, MD   25 mg at 01/21/12 2147  . lidocaine (LIDODERM) 5 % 1 patch  1 patch Transdermal Q24H Mike Craze, MD   1 patch at 01/22/12 1127  . magnesium hydroxide (MILK OF MAGNESIA) suspension 30 mL  30 mL Oral Daily PRN Mike Craze, MD      . meloxicam [facility]) tablet 7.5 mg  7.5 mg Oral q morning - 10a Mike Craze, MD   7.5 mg at 01/22/12 1005  . nicotine (NICODERM CQ - dosed in mg/24 hours) patch 21 mg  21 mg Transdermal Q0600 Mike Craze, MD   21 mg at 01/22/12 0649  . DISCONTD: diphenhydrAMINE (BENADRYL) capsule 50 mg  50 mg Oral Q8H PRN Nehemiah Settle, MD      . DISCONTD: traZODone (DESYREL) tablet 100 mg  100 mg Oral QHS Nehemiah Settle, MD        Lab Results: No results found for this or any previous visit (from the past 48 hour(s)).  Physical Findings: AIMS:  , ,  ,  ,    CIWA:    COWS:     Treatment Plan Summary: Daily contact with patient to assess and evaluate symptoms and progress in treatment Medication management  Plan: Continue current treatment plan and medication management. No medication changes made during the visit. Patient  has been thinking about the disposition plans unwilling to talk with the primary team and case manager tomorrow morning.    Orel Cooler,JANARDHAHA R. 01/22/2012, 11:34 AM

## 2012-01-22 NOTE — Progress Notes (Signed)
Patient is visible on the unit today. Appears well groomed. Slept fair but appetite is good. Patient has been reminded that napping during the day can interfere with nighttime sleep. Rates depression/hopelessness at 1. Denies SI. 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 Patient observed interacting positively with peers on the unit. Observed attending groups. Remains safe on the unit.

## 2012-01-23 ENCOUNTER — Other Ambulatory Visit (HOSPITAL_COMMUNITY): Payer: Self-pay

## 2012-01-23 MED ORDER — TIZANIDINE HCL 4 MG PO TABS: 4.0000 mg | ORAL_TABLET | Freq: Three times a day (TID) | ORAL | Status: DC | PRN

## 2012-01-23 MED ORDER — LIDOCAINE 5 % EX PTCH: 1.0000 | MEDICATED_PATCH | CUTANEOUS | Status: AC

## 2012-01-23 MED ORDER — MELOXICAM 7.5 MG PO TABS: 15.0000 mg | ORAL_TABLET | Freq: Every morning | ORAL | Status: DC

## 2012-01-23 MED ORDER — AMITRIPTYLINE HCL 25 MG PO TABS: 25.0000 mg | ORAL_TABLET | Freq: Every day | ORAL | Status: DC

## 2012-01-23 NOTE — Discharge Instructions (Signed)
Attend 180 meetings in 90 days. Get trusted sponsor from the advise of others or from whomever in meetings seems to make sense, has a proven track record, will hold you responsible for your sobriety, and both expects and insists on total abstinence.  Work the steps HONESTLY with the trusted sponsor. Get obsessed with your recovery by often reminding yourself of how DEADLY this dredged horrible disease of addiction JUST IS. Focus the first month on speaker meetings where you specifically look at how your life has been wrecked by drugs/alcohol and how your life has been similar to that of the speakers.  Anti whatever Alphabet (Where whatever stands for depression, anxiety, pain or BS in your life.)  A lternate between completely different solutions  B ehold beauty  C ommune with nature  D isplay affection thru hugs, words, understanding or Dance to new/different music  E at healthy food (like Fish Oil)  F eed wildlife  G o fishing  H ike in the woods or H unt down someone who has successfully met similar chalanges  I nspire someone else  J og or J ump into a new hobby  K eep a diary of your successes  L isten to soothing music or L augh at yourself  M ake music/ art/ poetry/ crafts  N otice something different about yourself, others, how others interact/ respond to each other, nature  O utside of yourself and typical way of doing/ thinking  P ick flowers  R andom acts of kindness without being discovered  S pend time on yourself/ Skin for beauty sake nails, teeth, hair, Soak in tub  T alk to a friend  U se grandma's ideas on how to handle things  V ary your routine  W alk  X tend your comfort zone where other's have invited you to go  Y oga/ other forms of meditation  Z ip up and go outside (or go outside of yourself)  

## 2012-01-23 NOTE — Progress Notes (Signed)
(  D) Patient reports sleep as fair, appetite good, energy level high and ability to pay attention good.Rates both depression and hopelessness as 1/10. Back pain rated 3-4. Will receive scheduled pain meds at 10.(A) Encouraged to attend group, review coping skills and identify additional supports to utilize after discharge. Suicide risk education reviewed with patient.  (R) Patient receptive to support, instruction and and encouragement. Attending groups. Interacting well in milieu. Joice Lofts RN MS EdS 01/23/2012  9:11 AM

## 2012-01-23 NOTE — Progress Notes (Signed)
  [facility]patient Family/Significant Other Suicide Prevention Education  Suicide Prevention Education:  Education Completed; Frank Mills, son, [phone]) has been identified by the patient as the family member/significant other  who will aid the patient in the event of a mental health crisis (suicidal ideations/suicide attempt).  With written consent from the patient, the family member/significant other has been provided the following suicide prevention education, prior to the and/or following the discharge of the patient.  The suicide prevention education provided includes the following:  Suicide risk factors  Suicide prevention and interventions  National Suicide Hotline telephone number  [facility] assessment telephone number  [facility] Emergency Assistance 911  [facility] and/or Residential Mobile Crisis Unit telephone number  Request made of family/significant other to:  Remove weapons (e.g., guns, rifles, knives), all items previously/currently identified as safety concern.    Remove drugs/medications (over-the-counter, prescriptions, illicit drugs), all items previously/currently identified as a safety concern.  Frank Mills did not have any safety concerns about Frank Mills. He stated that his main goal was for his father to get substance abuse treatment, and counselor informed him that Frank Mills has been referred for outpatient follow up on substance abuse. Frank Mills verbalized understanding of suicide prevention information and had no further questions. He stated that Frank Mills has no access to firearms.  Billie Lade 01/23/2012, 11:08 AM

## 2012-01-23 NOTE — [facility] Counselor (Signed)
Psychoeducational Group Note  Date:  01/23/2012 Time:  11am  Group Topic/Focus: dimensions of wellness    Participation Level:  Minimal  Participation Quality:  Inattentive  Affect:  Appropriate  Cognitive:  Appropriate  Insight:  Limited  Engagement in Group:  Limited  Additional Comments:  Raiford Noble was present for about half of the group. While he was there, he was working on something else and not attentive in the group. When asked a few of questions, he did respond. He reported that he feels that he has good social support from his son and mom.   Christy Sartorius. 01/23/2012, 12:15 PM

## 2012-01-23 NOTE — Tx Team (Signed)
Interdisciplinary Treatment Plan Update (Adult)  Date:  01/23/2012  Time Reviewed:  10:47 AM   Progress in Treatment: Attending groups: Yes Participating in groups:  Yes Taking medication as prescribed:  Yes Tolerating medication: Yes Family/Significant othe contact made: Attempting to contact son, Frank Mills Patient understands diagnosis: Yes Discussing patient identified problems/goals with staff:  Yes Medical problems stabilized or resolved: Yes Denies suicidal/homicidal ideation: Yes Issues/concerns per patient self-inventory:  No  Other:  New problem(s) identified: None  Reason for Continuation of Hospitalization: Appropriate for discharge today  Interventions implemented related to continuation of hospitalization:  Medication stabilization, safety checks q 15 mins, group attendance  Additional comments:  Estimated length of stay: Discharge today  Discharge Plan: discharge home and follow up with [facility]  New goal(s):  Review of initial/current patient goals per problem list:   1. Goal(s): Eliminate SI/other thoughts of self harm  Met: Yes  Target date: d/c  As evidenced by: Frank Mills denies any suicidal thoughts   2. Goal (s): Reduce depression/anxiety to 4 or less Met: Yes  Target date: d/c  As evidenced by: Patient currently rating symptoms at 0   3. Goal(s): Stabilize on meds  Met: YES  Target date: d/c  As evidenced by: Frank Mills reports medications have helped to decrease symptoms without intolerable side effects  4. Goal(s): Refer for outpatient follow up  Met: Yes Target date: d/c  As evidenced by: .Outpatient follow up scheduled with [facility]     Attendees: Patient:   01/23/2012 10:47 AM  Family:   01/23/2012 10:47 AM  Physician:  Dr Orson Aloe, MD 01/23/2012 10:47 AM  Nursing:    01/23/2012 10:47 AM  Case Manager:  Juline Patch, LCSW 01/23/2012 10:47 AM  Counselor:  Angus Palms, LCSW 01/23/2012 10:47 AM  Other:  Reyes Ivan, LCSWA  01/23/2012 10:47 AM  Other:   01/23/2012 10:47 AM  Other:   01/23/2012 10:47 AM  Other:   01/23/2012 10:47 AM   Scribe for Treatment Team:   Billie Lade, 01/23/2012 10:47 AM

## 2012-01-23 NOTE — Progress Notes (Signed)
Discharge Note: Patient eager for discharge. Refused Lidocaine Patch today. Suicide risk prevention information reviewed along with patient's identified coping skills. No questions voiced. Joice Lofts RN MS EdS 01/23/2012  11:46 AM

## 2012-01-23 NOTE — Progress Notes (Signed)
Patient escorted off unit and taken home by [facility]. Belongings returned. Joice Lofts RN MS EdS 01/23/2012  2:06 PM

## 2012-01-23 NOTE — H&P (Signed)
Medical/psychiatric screening examination/treatment/procedure(s) were performed by non-physician practitioner and as supervising physician I was immediately available for consultation/collaboration.  I have seen and examined this patient and agree the major elements of this evaluation.  

## 2012-01-23 NOTE — [facility] Suicide Risk Assessment (Signed)
Suicide Risk Assessment  Discharge Assessment     Demographic factors:  Male;Caucasian    Current Mental Status Per Nursing Assessment::   On Admission:  Suicidal ideation indicated by patient;Suicide plan;Self-harm behaviors;Intention to act on suicide plan;Belief that plan would result in death At Discharge:     Current Mental Status Per Physician:  Loss Factors:  (wife incarcerated)  Historical Factors:    Risk Reduction Factors:   Sense of responsibility to family;Living with another person, especially a relative;Positive social support  Continued Clinical Symptoms:  Depression:   Comorbid alcohol abuse/dependence Alcohol/Substance Abuse/Dependencies Chronic Pain Previous Psychiatric Diagnoses and Treatments  Discharge Diagnoses:   AXIS I:  Substance Induced Mood Disorder and Cocaine Dependence and Nicotine Dependence AXIS II:  Deferred AXIS III:   Past Medical History  Diagnosis Date  . Back pain   . Depression   . Suicidal ideation   . Anxiety   . Panic    AXIS IV:  other psychosocial or environmental problems, problems related to social environment and problems with primary support group AXIS V:  51-60 moderate symptoms  Cognitive Features That Contribute To Risk:  Closed-mindedness Thought constriction (tunnel vision)    Suicide Risk:  Minimal: No identifiable suicidal ideation.  Patients presenting with no risk factors but with morbid ruminations; may be classified as minimal risk based on the severity of the depressive symptoms  Diagnosis:   Axis I: Substance Induced Mood Disorder and Cocaine Dependence as well as Suicide Atempt Axis II: Deferred Axis III:  Past Medical History  Diagnosis Date  . Back pain   . Depression   . Suicidal ideation   . Anxiety   . Panic    ADL's:  Intact  Sleep: Fair  Appetite:  Fair  Suicidal Ideation:  Denies any suicidal thoughts. Homicidal Ideation:  Denies adamantly any homicidal thoughts.  Mental  Status Examination/Evaluation: Objective:  Appearance: Casual  Eye Contact::  Good  Speech:  Clear and Coherent  Volume:  Normal  Mood:  Euthymic  Affect:  Congruent  Thought Process:  Coherent  Orientation:  Full  Thought Content:  WDL  Suicidal Thoughts:  No  Homicidal Thoughts:  No  Memory:  Immediate;   Fair  Judgement:  Fair  Insight:  Fair  Psychomotor Activity:  Normal  Concentration:  Good  Recall:  Good  Akathisia:  No  Handed:  Right  AIMS (if indicated):     Assets:  Communication Skills Desire for Improvement  Sleep:  Number of Hours: 4.25    Vital Signs:Blood pressure 116/77, pulse 68, temperature 98.4 F (36.9 C), temperature source Oral, resp. rate 16, height 6\' 3"  (1.905 m), weight 93.441 kg (206 lb). Current Medications: Current Facility-Administered Medications  Medication Dose Route Frequency Provider Last Rate Last Dose  . acetaminophen (TYLENOL) tablet 650 mg  650 mg Oral Q6H PRN Mike Craze, MD      . alum & mag hydroxide-simeth (MAALOX/MYLANTA) 200-200-20 MG/5ML suspension 30 mL  30 mL Oral Q4H PRN Mike Craze, MD      . amitriptyline (ELAVIL) tablet 25 mg  25 mg Oral QHS Mike Craze, MD   25 mg at 01/22/12 2156  . lidocaine (LIDODERM) 5 % 1 patch  1 patch Transdermal Q24H Mike Craze, MD   1 patch at 01/22/12 1127  . magnesium hydroxide (MILK OF MAGNESIA) suspension 30 mL  30 mL Oral Daily PRN Mike Craze, MD      . meloxicam [facility]) tablet 7.5  mg  7.5 mg Oral q morning - 10a Mike Craze, MD   7.5 mg at 01/23/12 1000  . nicotine (NICODERM CQ - dosed in mg/24 hours) patch 21 mg  21 mg Transdermal Q0600 Mike Craze, MD   21 mg at 01/23/12 1191    Lab Results:  No results found for this or any previous visit (from the past 72 hour(s)).  RISK REDUCTION FACTORS: What pt has learned from hospital stay is to not do crazy things and stay away from bad friends.  (He was instructed that the term friend can not be used when referring to  people that use.)  Risk of self harm is elevated by his depression, his chronic pain, and his addictions, but he has decided that he has everything to live for including himself.  Risk of harm to others is minimal in that he has not been involved in fights or had any legal charges filed on him.  Pt seen in treatment team where he divulged the above information. The treatment team concluded that he was ready for discharge and had met his goals for an inpatient setting.   PLAN: Discharge home Continue Medication List  As of 01/23/2012 12:24 PM   STOP taking these medications         naproxen 500 MG tablet      oxyCODONE-acetaminophen 10-325 MG per tablet         TAKE these medications      Indication    amitriptyline 25 MG tablet   Commonly known as: ELAVIL   Take 1 tablet (25 mg total) by mouth at bedtime. For pain management, depression, and insomnia.       lidocaine 5 %   Commonly known as: LIDODERM   Place 1 patch onto the skin daily. Remove & Discard patch within 12 hours or as directed by MD. For pain management       meloxicam 7.5 MG tablet   Commonly known as: MOBIC   Take 2 tablets (15 mg total) by mouth every morning. For management of pain.       tiZANidine 4 MG tablet   Commonly known as: ZANAFLEX   Take 1 tablet (4 mg total) by mouth every 8 (eight) hours as needed (spasms).            Follow-up recommendations:  Activities: Resume typical activities Diet: Resume typical diet Other: Follow up with outpatient provider and report any side effects to out patient prescriber.  Plan: Pt has done well.  He notes that he feels the best he has felt in 18 years. He has met his inpatient goals and is ready for D/C. D/C today.   Mellissa Conley 01/23/2012, 12:21 PM

## 2012-01-23 NOTE — Progress Notes (Addendum)
[facility] Case Management Discharge Plan:  Will you be returning to the same living situation after discharge: Yes,  Returning to his home At discharge, do you have transportation home?:Yes,  To arrange for family to transport him home  Do you have the ability to pay for your medications:Yes,  Patient has Medicare/Medicaid  Interagency Information:     Release of information consent forms completed and in the chart;  Patient's signature needed at discharge.  Patient to Follow up at:  [facility]   [phone]    Wednesday, January 25, 2012 at 7:45 AM    [address]    [place], [place]  [address]    Patient denies SI/HI:   Yes,  Patient no longer endorsing SI    Safety Planning and Suicide Prevention discussed:  Yes,  Reviewed with all patients during d/c planning groups  Barrier to discharge identified:Yes,  Wife incarcerated  Summary and Recommendations: Patient encourage to be compliant with medications and follow up with outpatient recommedations    Blas Riches, Joesph July 01/23/2012, 11:08 AM

## 2012-01-23 NOTE — Progress Notes (Signed)
[facility] MD Progress Note  01/23/2012 12:11 PM  Diagnosis:   Axis I: Substance Induced Mood Disorder and Cocaine Dependence as well as Suicide Atempt Axis II: Deferred Axis III:  Past Medical History  Diagnosis Date  . Back pain   . Depression   . Suicidal ideation   . Anxiety   . Panic    ADL's:  Intact  Sleep: Fair  Appetite:  Fair  Suicidal Ideation:  Denies any suicidal thoughts. Homicidal Ideation:  Denies adamantly any homicidal thoughts.  Mental Status Examination/Evaluation: Objective:  Appearance: Casual  Eye Contact::  Good  Speech:  Clear and Coherent  Volume:  Normal  Mood:  Euthymic  Affect:  Congruent  Thought Process:  Coherent  Orientation:  Full  Thought Content:  WDL  Suicidal Thoughts:  No  Homicidal Thoughts:  No  Memory:  Immediate;   Fair  Judgement:  Fair  Insight:  Fair  Psychomotor Activity:  Normal  Concentration:  Good  Recall:  Good  Akathisia:  No  Handed:  Right  AIMS (if indicated):     Assets:  Communication Skills Desire for Improvement  Sleep:  Number of Hours: 4.25    Vital Signs:Blood pressure 116/77, pulse 68, temperature 98.4 F (36.9 C), temperature source Oral, resp. rate 16, height 6\' 3"  (1.905 m), weight 93.441 kg (206 lb). Current Medications: Current Facility-Administered Medications  Medication Dose Route Frequency Provider Last Rate Last Dose  . acetaminophen (TYLENOL) tablet 650 mg  650 mg Oral Q6H PRN Mike Craze, MD      . alum & mag hydroxide-simeth (MAALOX/MYLANTA) 200-200-20 MG/5ML suspension 30 mL  30 mL Oral Q4H PRN Mike Craze, MD      . amitriptyline (ELAVIL) tablet 25 mg  25 mg Oral QHS Mike Craze, MD   25 mg at 01/22/12 2156  . lidocaine (LIDODERM) 5 % 1 patch  1 patch Transdermal Q24H Mike Craze, MD   1 patch at 01/22/12 1127  . magnesium hydroxide (MILK OF MAGNESIA) suspension 30 mL  30 mL Oral Daily PRN Mike Craze, MD      . meloxicam [facility]) tablet 7.5 mg  7.5 mg Oral q morning -  10a Mike Craze, MD   7.5 mg at 01/23/12 1000  . nicotine (NICODERM CQ - dosed in mg/24 hours) patch 21 mg  21 mg Transdermal Q0600 Mike Craze, MD   21 mg at 01/23/12 4540    Lab Results:  No results found for this or any previous visit (from the past 48 hour(s)).  Physical Findings: AIMS:  , ,  ,  ,    CIWA:    COWS:     Treatment Plan Summary: Daily contact with patient to assess and evaluate symptoms and progress in treatment Medication management  Plan: Pt has done well.  He notes that he feels the best he has felt in 18 years. He has met his inpatient goals and is ready for D/C. D/C today.  Sharaine Delange 01/23/2012, 12:11 PM

## 2012-01-23 NOTE — Discharge Summary (Signed)
Physician Discharge Summary Note  Patient:  Frank Mills is an 51 y.o., male MRN:  [id number] DOB:  1961-04-01 Patient phone:  [phone] (home)  Patient address:   [address] [place] [address],   Date of Admission:  01/20/2012 Date of Discharge: 01/23/2012  Discharge Diagnoses: Principal Problem:  *Cocaine dependence Active Problems:  Suicide attempt  Psychoactive substance-induced organic mood disorder  Axis Diagnosis:   AXIS I:  Substance Induced Mood Disorder and Cocaine Dependence, Opiate use, nicotine Dependence, Suicide attempt carbonmonoxide AXIS II:  Deferred AXIS III:   Past Medical History  Diagnosis Date  . Back pain   . Depression   . Suicidal ideation   . Anxiety   . Panic    AXIS IV:  other psychosocial or environmental problems, problems related to social environment and problems with primary support group AXIS V:  51-60 moderate symptoms  Level of Care:  OP  Hospital Course:   "This is a 51 year old Caucasian male, admitted to [facility] from the [facility] ED with complaints of suicide attempt. Patient reports, "I attempted suicide yesterday. I tried to poison myself with carbon monoxide. I put a hose on the tail pipe of my car. Then I slid the other end of the hose inside the car and shut the car door. I went inside of the car, started the car and waited to see what happens. I sat inside the car, waited for an hour and nothing happened. I got out of the car, took off the hose from the tail pipe of the car. I then went out got something to eat, watched TV. Later on, I sent a text to my brother-inlaw telling him what I had done. What happened was, I went on a cocaine binge x 2 weeks, Then suicide pooped up in my head, I decided to do it and I tried to do it too. It was weird because I have never attempted to do something like this prior to this. I have not even thought about suicide. I think the cocaine interacted with the new medicine that I was taking for  my pain. It is called Zanaflex. I don't want to take that medicine again. I used to do a $1,000.00 worth of cocaine everyday, take my Percocet pills and yet nothing happens. This kind of thoughts was not there. After my brother-inlaw got my text messages, he called the sheriff who came and took me to the hospital. I am not depressed any more"   Pt was admitted and placed on Elavil, Voltaren, then Lidoderm as well as Mobic and Zanaflex for his back pain.  This helped his back feel the best it has felt in a long time.  He participated in groups and attended AA/NA meetings.  He discovered that he felt the most calm that he had felt in 18 years.  He was off abusable drugs and realized that he didn't have to have them at all.  He was intersted in going back to his church and getting that support to stay clean.  Patient attended treatment team meeting this am and met with treatment team members. Pt symptoms, treatment plan and response to treatment discussed. Frank Mills endorsed that their symptoms have improved. Pt also stated that they are stable for discharge. In other to maintain his sobriety, they will continue psychiatric care on outpatient basis. They will follow-up at [facility] at 7:45 AM on June 26.  Upon discharge, patient adamantly denies suicidal, homicidal  ideations, auditory, visual hallucinations and or delusional thinking. They left [facility] with all personal belongings via peace officer transportation in no apparent distress.  Consults:  None  Significant Diagnostic Studies:  labs: UDS positive for Cocaine. carboxyhemoglobin 2.6  Discharge Vitals:   Blood pressure 116/77, pulse 68, temperature 98.4 F (36.9 C), temperature source Oral, resp. rate 16, height 6\' 3"  (1.905 m), weight 93.441 kg (206 lb).  Mental Status Exam: See Mental Status Examination and Suicide Risk Assessment completed by Attending Physician prior to discharge.  Discharge destination:  Home  Is patient on  multiple antipsychotic therapies at discharge:  No   Has Patient had three or more failed trials of antipsychotic monotherapy by history: N/A Recommended Plan for Multiple Antipsychotic Therapies: N/A  Medication List  As of 01/23/2012 11:54 PM   STOP taking these medications         naproxen 500 MG tablet      oxyCODONE-acetaminophen 10-325 MG per tablet         TAKE these medications      Indication    amitriptyline 25 MG tablet   Commonly known as: ELAVIL   Take 1 tablet (25 mg total) by mouth at bedtime. For pain management, depression, and insomnia.       lidocaine 5 %   Commonly known as: LIDODERM   Place 1 patch onto the skin daily. Remove & Discard patch within 12 hours or as directed by MD. For pain management       meloxicam 7.5 MG tablet   Commonly known as: MOBIC   Take 2 tablets (15 mg total) by mouth every morning. For management of pain.       tiZANidine 4 MG tablet   Commonly known as: ZANAFLEX   Take 1 tablet (4 mg total) by mouth every 8 (eight) hours as needed (spasms).            Follow-up Information    Follow up with [facility] on 01/25/2012. (You are scheduled at [facility] at 7:45 on Wednesday, June 26. 2013)    Contact information:   [address] [place], [place]   [address]  [phone]         Follow-up recommendations:   Activities: Resume typical activities Diet: Resume typical diet Other: Follow up with outpatient provider and report any side effects to out patient prescriber.  SignedDan Humphreys, Ludmila Ebarb 01/23/2012, 11:54 PM

## 2012-01-25 NOTE — Progress Notes (Signed)
Patient Discharge Instructions:  After Visit Summary (AVS):   Faxed to:  01/25/2012 Psychiatric Admission Assessment Note:   Faxed to:  01/25/2012 Suicide Risk Assessment - Discharge Assessment:   Faxed to:  01/25/2012 Faxed/Sent to the Next Level Care provider:  01/25/2012\  Faxed to [facility] @ [phone]  Heloise Purpura, Eduard Clos, 01/25/2012, 3:53 PM

## 2012-04-20 ENCOUNTER — Emergency Department (HOSPITAL_COMMUNITY)
Admission: EM | Admit: 2012-04-20 | Discharge: 2012-04-20 | Disposition: A | Discharge status: Home / Self Care | Payer: Medicare Other | Attending: Emergency Medicine | Admitting: Emergency Medicine

## 2012-04-20 ENCOUNTER — Encounter (HOSPITAL_COMMUNITY): Payer: Self-pay | Admitting: Emergency Medicine

## 2012-04-20 DIAGNOSIS — F17200 Nicotine dependence, unspecified, uncomplicated: Secondary | ICD-10-CM | POA: Insufficient documentation

## 2012-04-20 DIAGNOSIS — K047 Periapical abscess without sinus: Secondary | ICD-10-CM

## 2012-04-20 DIAGNOSIS — F329 Major depressive disorder, single episode, unspecified: Secondary | ICD-10-CM | POA: Insufficient documentation

## 2012-04-20 DIAGNOSIS — Z886 Allergy status to analgesic agent status: Secondary | ICD-10-CM | POA: Insufficient documentation

## 2012-04-20 DIAGNOSIS — F3289 Other specified depressive episodes: Secondary | ICD-10-CM | POA: Insufficient documentation

## 2012-04-20 DIAGNOSIS — K029 Dental caries, unspecified: Secondary | ICD-10-CM | POA: Insufficient documentation

## 2012-04-20 DIAGNOSIS — F411 Generalized anxiety disorder: Secondary | ICD-10-CM | POA: Insufficient documentation

## 2012-04-20 MED ORDER — OXYCODONE-ACETAMINOPHEN 5-325 MG PO TABS: 1.0000 | ORAL_TABLET | ORAL | Status: DC | PRN

## 2012-04-20 MED ORDER — AMOXICILLIN 500 MG PO CAPS: 500.0000 mg | ORAL_CAPSULE | Freq: Three times a day (TID) | ORAL | Status: DC

## 2012-04-20 NOTE — ED Notes (Signed)
Pt c/o dental pain. Pt stated he cant afford dental treatment. nad noted.

## 2012-04-20 NOTE — ED Provider Notes (Signed)
Medical screening examination/treatment/procedure(s) were performed by non-physician practitioner and as supervising physician I was immediately available for consultation/collaboration.   Medrith Veillon L Missey Hasley, MD 04/20/12 1433 

## 2012-04-20 NOTE — ED Provider Notes (Signed)
History     CSN: [id number]  Arrival date & time 04/20/12  1004   First MD Initiated Contact with Patient 04/20/12 1008      Chief Complaint  Patient presents with  . Dental Pain    (Consider location/radiation/quality/duration/timing/severity/associated sxs/prior treatment) HPI Comments: [place]  presents with a 1 day history of dental pain and gingival swelling.   The patient has a history of injury to the tooth involved which has recently started to cause pain.  He ate a local restaurant last night, and forgetting,  Chewed on the left side of his mouth causing worse pain.  There has been no fevers,  Chills, nausea or vomiting, also no complaint of difficulty swallowing,  Although chewing makes pain worse.  The patient has tried ibuprofen without relief of symptoms.      The history is provided by the patient.    Past Medical History  Diagnosis Date  . Back pain   . Depression   . Suicidal ideation   . Anxiety   . Panic     Past Surgical History  Procedure Date  . Back surgery   . Hernia repair   . Appendectomy   . Thumb fusion   . Nasal septum surgery     History reviewed. No pertinent family history.  History  Substance Use Topics  . Smoking status: Current Every Day Smoker  . Smokeless tobacco: Not on file  . Alcohol Use: No      Review of Systems  Constitutional: Negative for fever.  HENT: Positive for dental problem. Negative for sore throat, facial swelling, neck pain and neck stiffness.   Respiratory: Negative for shortness of breath.     Allergies  Morphine and related  Home Medications   Current Outpatient Rx  Name Route Sig Dispense Refill  . IBUPROFEN 200 MG PO TABS Oral Take 600 mg by mouth every 6 (six) hours as needed. For dental pain    . AMOXICILLIN 500 MG PO CAPS Oral Take 1 capsule (500 mg total) by mouth 3 (three) times daily. 30 capsule 0  . OXYCODONE-ACETAMINOPHEN 5-325 MG PO TABS Oral Take 1 tablet by mouth every 4  (four) hours as needed for pain. 20 tablet 0    BP 115/96  Pulse 63  Temp 98.3 F (36.8 C) (Oral)  Resp 18  Ht 6\' 3"  (1.905 m)  Wt 215 lb (97.523 kg)  BMI 26.87 kg/m2  SpO2 100%  Physical Exam  Constitutional: He is oriented to person, place, and time. He appears well-developed and well-nourished. No distress.  HENT:  Head: Normocephalic and atraumatic.  Right Ear: Tympanic membrane and external ear normal.  Left Ear: Tympanic membrane and external ear normal.  Mouth/Throat: Oropharynx is clear and moist and mucous membranes are normal. No oral lesions. Dental abscesses and dental caries present.    Eyes: Conjunctivae normal are normal.  Neck: Normal range of motion. Neck supple.  Cardiovascular: Normal rate and normal heart sounds.   Pulmonary/Chest: Effort normal.  Abdominal: He exhibits no distension.  Musculoskeletal: Normal range of motion.  Lymphadenopathy:    He has no cervical adenopathy.  Neurological: He is alert and oriented to person, place, and time.  Skin: Skin is warm and dry. No erythema.  Psychiatric: He has a normal mood and affect.    ED Course  Procedures (including critical care time)  Labs Reviewed - No data to display No results found.   1. Dental abscess  MDM  Amoxil,  Oxycodone prescribed.  Dental referral given.  The patient appears reasonably screened and/or stabilized for discharge and I doubt any other medical condition or other [facility] requiring further screening, evaluation, or treatment in the ED at this time prior to discharge.         Burgess Amor, PA 04/20/12 1046

## 2012-04-26 ENCOUNTER — Encounter (HOSPITAL_COMMUNITY): Payer: Self-pay | Admitting: Emergency Medicine

## 2012-04-26 ENCOUNTER — Emergency Department (HOSPITAL_COMMUNITY)
Admission: EM | Admit: 2012-04-26 | Discharge: 2012-04-26 | Disposition: A | Discharge status: Home / Self Care | Payer: Medicare Other | Attending: Emergency Medicine | Admitting: Emergency Medicine

## 2012-04-26 ENCOUNTER — Emergency Department (HOSPITAL_COMMUNITY): Payer: Medicare Other

## 2012-04-26 DIAGNOSIS — M5137 Other intervertebral disc degeneration, lumbosacral region: Secondary | ICD-10-CM | POA: Insufficient documentation

## 2012-04-26 DIAGNOSIS — F17200 Nicotine dependence, unspecified, uncomplicated: Secondary | ICD-10-CM | POA: Insufficient documentation

## 2012-04-26 DIAGNOSIS — M545 Low back pain, unspecified: Secondary | ICD-10-CM | POA: Insufficient documentation

## 2012-04-26 DIAGNOSIS — M51379 Other intervertebral disc degeneration, lumbosacral region without mention of lumbar back pain or lower extremity pain: Secondary | ICD-10-CM | POA: Insufficient documentation

## 2012-04-26 DIAGNOSIS — Z79899 Other long term (current) drug therapy: Secondary | ICD-10-CM | POA: Insufficient documentation

## 2012-04-26 DIAGNOSIS — M5416 Radiculopathy, lumbar region: Secondary | ICD-10-CM

## 2012-04-26 DIAGNOSIS — Z885 Allergy status to narcotic agent status: Secondary | ICD-10-CM | POA: Insufficient documentation

## 2012-04-26 MED ORDER — OXYCODONE-ACETAMINOPHEN 5-325 MG PO TABS: ORAL_TABLET | ORAL | Status: DC

## 2012-04-26 MED ORDER — PREDNISONE 20 MG PO TABS
60.0000 mg | ORAL_TABLET | Freq: Once | ORAL | Status: AC
  Administered 2012-04-26: 60 mg via ORAL
  Filled 2012-04-26: qty 3

## 2012-04-26 MED ORDER — OXYCODONE-ACETAMINOPHEN 5-325 MG PO TABS
1.0000 | ORAL_TABLET | Freq: Once | ORAL | Status: AC
  Administered 2012-04-26: 1 via ORAL
  Filled 2012-04-26: qty 1

## 2012-04-26 MED ORDER — PREDNISONE 50 MG PO TABS: 50.0000 mg | ORAL_TABLET | Freq: Every day | ORAL | Status: DC
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
## 2012-04-26 NOTE — ED Notes (Signed)
Patient with no complaints at this time. Respirations even and unlabored. Skin warm/dry. Discharge instructions reviewed with patient at this time. Patient given opportunity to voice concerns/ask questions. Patient discharged at this time and left Emergency Department with steady gait.   
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
## 2012-04-28 NOTE — ED Provider Notes (Signed)
Medical screening examination/treatment/procedure(s) were performed by non-physician practitioner and as supervising physician I was immediately available for consultation/collaboration.   Carleene Cooper III, MD 04/28/12 0130

## 2012-05-29 ENCOUNTER — Emergency Department (HOSPITAL_COMMUNITY)
Admission: EM | Admit: 2012-05-29 | Discharge: 2012-05-29 | Disposition: A | Discharge status: Home / Self Care | Payer: Medicare Other | Attending: Emergency Medicine | Admitting: Emergency Medicine

## 2012-05-29 ENCOUNTER — Encounter (HOSPITAL_COMMUNITY): Payer: Self-pay | Admitting: *Deleted

## 2012-05-29 DIAGNOSIS — K029 Dental caries, unspecified: Secondary | ICD-10-CM

## 2012-05-29 DIAGNOSIS — H109 Unspecified conjunctivitis: Secondary | ICD-10-CM | POA: Insufficient documentation

## 2012-05-29 DIAGNOSIS — F17200 Nicotine dependence, unspecified, uncomplicated: Secondary | ICD-10-CM | POA: Insufficient documentation

## 2012-05-29 DIAGNOSIS — F329 Major depressive disorder, single episode, unspecified: Secondary | ICD-10-CM | POA: Insufficient documentation

## 2012-05-29 DIAGNOSIS — R45851 Suicidal ideations: Secondary | ICD-10-CM | POA: Insufficient documentation

## 2012-05-29 DIAGNOSIS — R21 Rash and other nonspecific skin eruption: Secondary | ICD-10-CM | POA: Insufficient documentation

## 2012-05-29 DIAGNOSIS — Z79899 Other long term (current) drug therapy: Secondary | ICD-10-CM | POA: Insufficient documentation

## 2012-05-29 DIAGNOSIS — L03211 Cellulitis of face: Secondary | ICD-10-CM | POA: Insufficient documentation

## 2012-05-29 DIAGNOSIS — F3289 Other specified depressive episodes: Secondary | ICD-10-CM | POA: Insufficient documentation

## 2012-05-29 DIAGNOSIS — L0201 Cutaneous abscess of face: Secondary | ICD-10-CM | POA: Insufficient documentation

## 2012-05-29 DIAGNOSIS — F411 Generalized anxiety disorder: Secondary | ICD-10-CM | POA: Insufficient documentation

## 2012-05-29 MED ORDER — FLUORESCEIN SODIUM 1 MG OP STRP
1.0000 | ORAL_STRIP | Freq: Once | OPHTHALMIC | Status: AC
  Administered 2012-05-29: 1 via OPHTHALMIC
  Filled 2012-05-29: qty 1

## 2012-05-29 MED ORDER — ERYTHROMYCIN 5 MG/GM OP OINT
TOPICAL_OINTMENT | Freq: Four times a day (QID) | OPHTHALMIC | Status: DC
  Administered 2012-05-29: 17:00:00 via OPHTHALMIC
  Filled 2012-05-29: qty 3.5

## 2012-05-29 MED ORDER — NAPROXEN 250 MG PO TABS: 250.0000 mg | ORAL_TABLET | Freq: Two times a day (BID) | ORAL | Status: DC

## 2012-05-29 MED ORDER — CLINDAMYCIN HCL 150 MG PO CAPS: ORAL_CAPSULE | ORAL | Status: DC

## 2012-05-29 MED ORDER — OXYCODONE-ACETAMINOPHEN 5-325 MG PO TABS: ORAL_TABLET | ORAL | Status: DC
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
## 2012-05-29 NOTE — ED Notes (Signed)
EDP at bedside flushing eye out with NS. Pt tolerated fair.  Pt states eye does feel better after flushing.

## 2012-05-29 NOTE — ED Notes (Signed)
  Dental pain, rt mandibular tooth. Also pain rt eye.

## 2012-05-29 NOTE — ED Notes (Signed)
Pt advised RN that he put vision in eyes. Pt asked to use prescribed eye ointment as ordered. Pt verbalized understanding

## 2012-06-14 ENCOUNTER — Encounter (HOSPITAL_COMMUNITY): Payer: Self-pay | Admitting: *Deleted

## 2012-06-14 ENCOUNTER — Emergency Department (HOSPITAL_COMMUNITY)
Admission: EM | Admit: 2012-06-14 | Discharge: 2012-06-14 | Disposition: A | Discharge status: Home / Self Care | Payer: Medicare Other | Attending: Emergency Medicine | Admitting: Emergency Medicine

## 2012-06-14 DIAGNOSIS — Z8659 Personal history of other mental and behavioral disorders: Secondary | ICD-10-CM | POA: Insufficient documentation

## 2012-06-14 DIAGNOSIS — K089 Disorder of teeth and supporting structures, unspecified: Secondary | ICD-10-CM | POA: Insufficient documentation

## 2012-06-14 DIAGNOSIS — K0889 Other specified disorders of teeth and supporting structures: Secondary | ICD-10-CM

## 2012-06-14 DIAGNOSIS — F17200 Nicotine dependence, unspecified, uncomplicated: Secondary | ICD-10-CM | POA: Insufficient documentation

## 2012-06-14 MED ORDER — HYDROCODONE-ACETAMINOPHEN 7.5-325 MG PO TABS: 1.0000 | ORAL_TABLET | ORAL | Status: DC | PRN

## 2012-06-14 MED ORDER — AMOXICILLIN 500 MG PO CAPS: 500.0000 mg | ORAL_CAPSULE | Freq: Three times a day (TID) | ORAL | Status: DC

## 2012-06-14 MED ORDER — MELOXICAM 7.5 MG PO TABS: ORAL_TABLET | ORAL | Status: DC

## 2012-06-14 NOTE — ED Provider Notes (Signed)
History     CSN: [id number]  Arrival date & time 06/14/12  1713   First MD Initiated Contact with Patient 06/14/12 1842      Chief Complaint  Patient presents with  . Dental Pain    (Consider location/radiation/quality/duration/timing/severity/associated sxs/prior treatment) HPI Comments: Pt report a cavity of the left lower jaw for some time. A piece broke off last night.  Patient is a 51 y.o. male presenting with tooth pain.  Dental PainPrimary symptoms do not include shortness of breath or cough.  Additional symptoms do not include: nosebleeds.    Past Medical History  Diagnosis Date  . Back pain   . Depression   . Suicidal ideation   . Anxiety   . Panic     Past Surgical History  Procedure Date  . Back surgery   . Hernia repair   . Appendectomy   . Thumb fusion   . Nasal septum surgery     No family history on file.  History  Substance Use Topics  . Smoking status: Current Every Day Smoker    Types: Cigarettes  . Smokeless tobacco: Not on file  . Alcohol Use: No      Review of Systems  Constitutional: Negative for activity change.       All ROS Neg except as noted in HPI  HENT: Positive for dental problem. Negative for nosebleeds and neck pain.   Eyes: Negative for photophobia and discharge.  Respiratory: Negative for cough, shortness of breath and wheezing.   Cardiovascular: Negative for chest pain and palpitations.  Gastrointestinal: Negative for abdominal pain and blood in stool.  Genitourinary: Negative for dysuria, frequency and hematuria.  Musculoskeletal: Negative for back pain and arthralgias.  Skin: Negative.   Neurological: Negative for dizziness, seizures and speech difficulty.  Psychiatric/Behavioral: Negative for hallucinations and confusion.    Allergies  Morphine and related  Home Medications   Current Outpatient Rx  Name  Route  Sig  Dispense  Refill  . AMOXICILLIN 500 MG PO CAPS   Oral   Take 1 capsule (500 mg total) by  mouth 3 (three) times daily.   30 capsule   0   . AMOXICILLIN 500 MG PO CAPS   Oral   Take 1 capsule (500 mg total) by mouth 3 (three) times daily.   21 capsule   0   . CLINDAMYCIN HCL 150 MG PO CAPS      3 tabs PO TID x 7 days   63 capsule   0   . HYDROCODONE-ACETAMINOPHEN 7.5-325 MG PO TABS   Oral   Take 1 tablet by mouth every 4 (four) hours as needed for pain.   20 tablet   0   . IBUPROFEN 200 MG PO TABS   Oral   Take 600 mg by mouth every 6 (six) hours as needed. For dental pain         . MELOXICAM 7.5 MG PO TABS      1 po bid with food, for swelling and inflammation.   12 tablet   0   . NAPROXEN 250 MG PO TABS   Oral   Take 1 tablet (250 mg total) by mouth 2 (two) times daily with a meal.   14 tablet   0   . OXYCODONE-ACETAMINOPHEN 5-325 MG PO TABS      One tab po q 4-6 hrs prn pain   20 tablet   0   . OXYCODONE-ACETAMINOPHEN 5-325 MG PO TABS  1 or 2 tabs PO q6h prn pain   20 tablet   0   . PREDNISONE 50 MG PO TABS   Oral   Take 1 tablet (50 mg total) by mouth daily.   6 tablet   0     BP 110/67  Temp 97.9 F (36.6 C) (Oral)  Resp 20  Ht 6\' 3"  (1.905 m)  Wt 230 lb (104.327 kg)  BMI 28.75 kg/m2  SpO2 98%  Physical Exam  Nursing note and vitals reviewed. Constitutional: He is oriented to person, place, and time. He appears well-developed and well-nourished.  Non-toxic appearance.  HENT:  Head: Normocephalic.  Right Ear: Tympanic membrane and external ear normal.  Left Ear: Tympanic membrane and external ear normal.       Deep cavity of the left lower pre molar. Mild to mod swelling around the tooth. No visible abscess. Airway patent. No swelling under the tongue.  Eyes: EOM and lids are normal. Pupils are equal, round, and reactive to light.  Neck: Normal range of motion. Neck supple. Carotid bruit is not present.  Cardiovascular: Normal rate, regular rhythm, normal heart sounds, intact distal pulses and normal pulses.     Pulmonary/Chest: Breath sounds normal. No respiratory distress.  Abdominal: Soft. Bowel sounds are normal. There is no tenderness. There is no guarding.  Musculoskeletal: Normal range of motion.  Lymphadenopathy:       Head (right side): No submandibular adenopathy present.       Head (left side): No submandibular adenopathy present.    He has no cervical adenopathy.  Neurological: He is alert and oriented to person, place, and time. He has normal strength. No cranial nerve deficit or sensory deficit.  Skin: Skin is warm and dry.  Psychiatric: He has a normal mood and affect. His speech is normal.    ED Course  Procedures (including critical care time)  Labs Reviewed - No data to display No results found.   1. Toothache       MDM  I have reviewed nursing notes, vital signs, and all appropriate lab and imaging results for this patient. Cavity with mild to mod swelling of the gum on the left. No swelling under the tongue. Rx for Norco and antibiotics given to the patient. Dental resources also given to the patient.        Kathie Dike, [place] 06/16/12 [id number]

## 2012-06-14 NOTE — ED Notes (Signed)
Dental pain to left side since last night.  

## 2012-06-24 NOTE — ED Provider Notes (Signed)
History/physical exam/procedure(s) were performed by non-physician practitioner and as supervising physician I was immediately available for consultation/collaboration. I have reviewed all notes and am in agreement with care and plan.   Hilario Quarry, MD 06/24/12 [phone]

## 2012-07-18 ENCOUNTER — Encounter (HOSPITAL_COMMUNITY): Payer: Self-pay | Admitting: Emergency Medicine

## 2012-07-18 ENCOUNTER — Emergency Department (HOSPITAL_COMMUNITY)
Admission: EM | Admit: 2012-07-18 | Discharge: 2012-07-18 | Disposition: A | Discharge status: Home / Self Care | Payer: Medicare Other | Attending: Emergency Medicine | Admitting: Emergency Medicine

## 2012-07-18 DIAGNOSIS — F17200 Nicotine dependence, unspecified, uncomplicated: Secondary | ICD-10-CM | POA: Insufficient documentation

## 2012-07-18 DIAGNOSIS — Z791 Long term (current) use of non-steroidal anti-inflammatories (NSAID): Secondary | ICD-10-CM | POA: Insufficient documentation

## 2012-07-18 DIAGNOSIS — K0889 Other specified disorders of teeth and supporting structures: Secondary | ICD-10-CM

## 2012-07-18 DIAGNOSIS — M549 Dorsalgia, unspecified: Secondary | ICD-10-CM | POA: Insufficient documentation

## 2012-07-18 DIAGNOSIS — K089 Disorder of teeth and supporting structures, unspecified: Secondary | ICD-10-CM | POA: Insufficient documentation

## 2012-07-18 DIAGNOSIS — Z79899 Other long term (current) drug therapy: Secondary | ICD-10-CM | POA: Insufficient documentation

## 2012-07-18 DIAGNOSIS — K029 Dental caries, unspecified: Secondary | ICD-10-CM | POA: Insufficient documentation

## 2012-07-18 DIAGNOSIS — Z8659 Personal history of other mental and behavioral disorders: Secondary | ICD-10-CM | POA: Insufficient documentation

## 2012-07-18 MED ORDER — TRAMADOL HCL 50 MG PO TABS: 50.0000 mg | ORAL_TABLET | Freq: Four times a day (QID) | ORAL | Status: DC | PRN

## 2012-07-18 NOTE — ED Notes (Signed)
Patient with no complaints at this time. Respirations even and unlabored. Skin warm/dry. Discharge instructions reviewed with patient at this time. Patient given opportunity to voice concerns/ask questions. Patient discharged at this time and left Emergency Department with steady gait.   

## 2012-07-18 NOTE — ED Provider Notes (Signed)
History     CSN: [id number]  Arrival date & time 07/18/12  1042   First MD Initiated Contact with Patient 07/18/12 1056      Chief Complaint  Patient presents with  . Dental Pain    (Consider location/radiation/quality/duration/timing/severity/associated sxs/prior treatment) HPI Comments: Patient comes to the ER for evaluation of left-sided toothache. Pain is on the lower side of his jaw. He reports that he has been having pain intermittently from a decayed and broken tooth for several months. In the last couple of days, the pain has progressively worsened. He reports severe pain. He has tried to go to the free dental clinic but has not been able to get in.  Patient is a 51 y.o. male presenting with tooth pain.  Dental PainThe primary symptoms include mouth pain. Primary symptoms do not include fever.    Past Medical History  Diagnosis Date  . Back pain   . Depression   . Suicidal ideation   . Anxiety   . Panic     Past Surgical History  Procedure Date  . Back surgery   . Hernia repair   . Appendectomy   . Thumb fusion   . Nasal septum surgery     History reviewed. No pertinent family history.  History  Substance Use Topics  . Smoking status: Current Every Day Smoker    Types: Cigarettes  . Smokeless tobacco: Not on file  . Alcohol Use: No      Review of Systems  Constitutional: Negative for fever.  HENT: Positive for dental problem.     Allergies  Morphine and related  Home Medications   Current Outpatient Rx  Name  Route  Sig  Dispense  Refill  . AMOXICILLIN 500 MG PO CAPS   Oral   Take 1 capsule (500 mg total) by mouth 3 (three) times daily.   30 capsule   0   . AMOXICILLIN 500 MG PO CAPS   Oral   Take 1 capsule (500 mg total) by mouth 3 (three) times daily.   21 capsule   0   . CLINDAMYCIN HCL 150 MG PO CAPS      3 tabs PO TID x 7 days   63 capsule   0   . HYDROCODONE-ACETAMINOPHEN 7.5-325 MG PO TABS   Oral   Take 1 tablet by  mouth every 4 (four) hours as needed for pain.   20 tablet   0   . IBUPROFEN 200 MG PO TABS   Oral   Take 600 mg by mouth every 6 (six) hours as needed. For dental pain         . MELOXICAM 7.5 MG PO TABS      1 po bid with food, for swelling and inflammation.   12 tablet   0   . NAPROXEN 250 MG PO TABS   Oral   Take 1 tablet (250 mg total) by mouth 2 (two) times daily with a meal.   14 tablet   0   . PREDNISONE 50 MG PO TABS   Oral   Take 1 tablet (50 mg total) by mouth daily.   6 tablet   0     BP 113/78  Pulse 55  Temp 97.9 F (36.6 C) (Oral)  Resp 18  Ht 6\' 3"  (1.905 m)  Wt 223 lb (101.152 kg)  BMI 27.87 kg/m2  SpO2 99%  Physical Exam  Constitutional: He is oriented to person, place, and time. He appears well-developed.  HENT:  Head: Normocephalic.       Poor dentition. Left lower most posterior molar severely decayed with tenderness to percussion  Neck: Normal range of motion.  Cardiovascular: Normal rate and regular rhythm.   Pulmonary/Chest: Effort normal and breath sounds normal.  Neurological: He is alert and oriented to person, place, and time.  Skin: Skin is warm.    ED Course  Procedures (including critical care time)  Labs Reviewed - No data to display No results found.   No diagnosis found.    MDM  The patient presents with dental pain secondary to dental caries. No sign of dental abscess at this time. Patient comes that he needs to follow up with dentist. Will provide analgesia.        Gilda Crease, MD 07/18/12 1110

## 2012-07-18 NOTE — ED Notes (Signed)
Pt c/o broken tooth to the left lower jaw.

## 2012-09-24 ENCOUNTER — Encounter (HOSPITAL_COMMUNITY): Payer: Self-pay | Admitting: *Deleted

## 2012-09-24 ENCOUNTER — Emergency Department (HOSPITAL_COMMUNITY)
Admission: EM | Admit: 2012-09-24 | Discharge: 2012-09-24 | Disposition: A | Discharge status: Home / Self Care | Payer: Medicare Other | Attending: Emergency Medicine | Admitting: Emergency Medicine

## 2012-09-24 DIAGNOSIS — Z8659 Personal history of other mental and behavioral disorders: Secondary | ICD-10-CM | POA: Insufficient documentation

## 2012-09-24 DIAGNOSIS — K089 Disorder of teeth and supporting structures, unspecified: Secondary | ICD-10-CM | POA: Insufficient documentation

## 2012-09-24 DIAGNOSIS — F17200 Nicotine dependence, unspecified, uncomplicated: Secondary | ICD-10-CM | POA: Insufficient documentation

## 2012-09-24 MED ORDER — OXYCODONE-ACETAMINOPHEN 5-325 MG PO TABS: ORAL_TABLET | ORAL | Status: DC

## 2012-09-24 MED ORDER — PENICILLIN V POTASSIUM 500 MG PO TABS: 500.0000 mg | ORAL_TABLET | Freq: Four times a day (QID) | ORAL | Status: DC

## 2012-09-24 NOTE — ED Provider Notes (Signed)
History     CSN: [id number]  Arrival date & time 09/24/12  1419   First MD Initiated Contact with Patient 09/24/12 1501      Chief Complaint  Patient presents with  . Dental Pain    (Consider location/radiation/quality/duration/timing/severity/associated sxs/prior treatment) HPI  Frank Mills is a 52 y.o. male complaining of Lower left tooth pain from tooth was broken over a year ago. Patient states when he was chewing 2 days ago more of the tooth broken the pain is becoming more severe. Patient has been taking Motrin with little relief. Patient denies any swelling to gums or mucous membranes, difficulty controlling his secretions, fever, nausea vomiting.  Past Medical History  Diagnosis Date  . Back pain   . Depression   . Suicidal ideation   . Anxiety   . Panic     Past Surgical History  Procedure Laterality Date  . Back surgery    . Hernia repair    . Appendectomy    . Thumb fusion    . Nasal septum surgery      No family history on file.  History  Substance Use Topics  . Smoking status: Current Every Day Smoker    Types: Cigarettes  . Smokeless tobacco: Not on file  . Alcohol Use: No      Review of Systems  Constitutional: Negative for fever.  HENT: Positive for dental problem.   Respiratory: Negative for shortness of breath.   Cardiovascular: Negative for chest pain.  Gastrointestinal: Negative for nausea, vomiting, abdominal pain and diarrhea.  All other systems reviewed and are negative.    Allergies  Morphine and related  Home Medications   Current Outpatient Rx  Name  Route  Sig  Dispense  Refill  . ibuprofen (ADVIL,MOTRIN) 200 MG tablet   Oral   Take 600 mg by mouth every 6 (six) hours as needed. For dental pain           BP 106/87  Pulse 67  Temp(Src) 98.6 F (37 C) (Oral)  Resp 19  Physical Exam  Nursing note and vitals reviewed. Constitutional: He is oriented to person, place, and time. He appears well-developed and  well-nourished. No distress.  HENT:  Head: Normocephalic.  Mouth/Throat:    Multiple dental caries and fractures, no swelling or tenderness to palpation of the gumline.  Eyes: Conjunctivae and EOM are normal.  Cardiovascular: Normal rate.   Pulmonary/Chest: Effort normal. No stridor.  Musculoskeletal: Normal range of motion.  Neurological: He is alert and oriented to person, place, and time.  Psychiatric: He has a normal mood and affect.    ED Course  Procedures (including critical care time)  Labs Reviewed - No data to display No results found.   1. Pain, dental       MDM   Uncomplicated dental pain I will start her prophylactically on Penicillin VK and person follows an outpatient with dentistry.   Pt verbalized understanding and agrees with care plan. Outpatient follow-up and return precautions given.    Discharge Medication List as of 09/24/2012  3:25 PM    START taking these medications   Details  oxyCODONE-acetaminophen (PERCOCET/ROXICET) 5-325 MG per tablet 1 to 2 tabs PO q6hrs  PRN for pain, Print    penicillin v potassium (VEETID) 500 MG tablet Take 1 tablet (500 mg total) by mouth 4 (four) times daily., Starting 09/24/2012, Until Discontinued, Print              [employer], PA-C  09/24/12 1649 

## 2012-09-24 NOTE — ED Notes (Signed)
Pt is here with left lower dental pain from broken tooth.

## 2012-09-25 NOTE — ED Provider Notes (Signed)
Medical screening examination/treatment/procedure(s) were performed by non-physician practitioner and as supervising physician I was immediately available for consultation/collaboration. Devoria Albe, MD, FACEP   Ward Givens, MD 09/25/12 0700

## 2012-10-15 ENCOUNTER — Emergency Department (HOSPITAL_COMMUNITY): Payer: Medicare Other

## 2012-10-15 ENCOUNTER — Encounter (HOSPITAL_COMMUNITY): Payer: Self-pay | Admitting: *Deleted

## 2012-10-15 ENCOUNTER — Emergency Department (HOSPITAL_COMMUNITY)
Admission: EM | Admit: 2012-10-15 | Discharge: 2012-10-15 | Disposition: A | Discharge status: Home / Self Care | Payer: Medicare Other | Attending: Emergency Medicine | Admitting: Emergency Medicine

## 2012-10-15 DIAGNOSIS — Y929 Unspecified place or not applicable: Secondary | ICD-10-CM | POA: Insufficient documentation

## 2012-10-15 DIAGNOSIS — F17200 Nicotine dependence, unspecified, uncomplicated: Secondary | ICD-10-CM | POA: Insufficient documentation

## 2012-10-15 DIAGNOSIS — W010XXA Fall on same level from slipping, tripping and stumbling without subsequent striking against object, initial encounter: Secondary | ICD-10-CM | POA: Insufficient documentation

## 2012-10-15 DIAGNOSIS — M549 Dorsalgia, unspecified: Secondary | ICD-10-CM | POA: Insufficient documentation

## 2012-10-15 DIAGNOSIS — Y939 Activity, unspecified: Secondary | ICD-10-CM | POA: Insufficient documentation

## 2012-10-15 DIAGNOSIS — Z8659 Personal history of other mental and behavioral disorders: Secondary | ICD-10-CM | POA: Insufficient documentation

## 2012-10-15 DIAGNOSIS — S335XXA Sprain of ligaments of lumbar spine, initial encounter: Secondary | ICD-10-CM | POA: Insufficient documentation

## 2012-10-15 MED ORDER — OXYCODONE-ACETAMINOPHEN 5-325 MG PO TABS: 1.0000 | ORAL_TABLET | ORAL | Status: DC | PRN

## 2012-10-15 MED ORDER — KETOROLAC TROMETHAMINE 60 MG/2ML IM SOLN
60.0000 mg | Freq: Once | INTRAMUSCULAR | Status: AC
  Administered 2012-10-15: 60 mg via INTRAMUSCULAR
  Filled 2012-10-15: qty 2

## 2012-10-15 MED ORDER — METHOCARBAMOL 500 MG PO TABS: 1000.0000 mg | ORAL_TABLET | Freq: Four times a day (QID) | ORAL | Status: AC

## 2012-10-15 MED ORDER — TRAMADOL HCL 50 MG PO TABS: 100.0000 mg | ORAL_TABLET | Freq: Three times a day (TID) | ORAL | Status: DC | PRN

## 2012-10-15 MED ORDER — NAPROXEN 500 MG PO TABS: 500.0000 mg | ORAL_TABLET | Freq: Two times a day (BID) | ORAL | Status: DC

## 2012-10-15 NOTE — ED Notes (Signed)
Pt c/o back pain after a fall that occurred this morning. Pt states he slipped on concrete and fell onto back. Pt denies hitting head. Pt ambulatory on arrival. Pt states "I feel stiff all the way down my back". Pt has hx of back pain.

## 2012-10-15 NOTE — ED Notes (Signed)
Slipped and fell this am onto concrete driveway.  Back pain. And rt hip pain.  No HI or LOC

## 2012-10-15 NOTE — ED Provider Notes (Signed)
History     CSN: [id number]  Arrival date & time 10/15/12  1310   First MD Initiated Contact with Patient 10/15/12 1323      Chief Complaint  Patient presents with  . Back Pain    (Consider location/radiation/quality/duration/timing/severity/associated sxs/prior treatment) HPI Comments: Frank Mills is a 52 y.o. Male presenting with acute on chronic lower back pain after slipping on ice this morning landing on his right lower back.  Pain is constant,  Sharp and without radiation.  He has been able to ambulate since the event and denies numbness, weakness ,  Has had no urinary or fecal retention or incontinence since the event.  He has taken no medicines prior to arrival.  He denies other injury including no head injury.     The history is provided by the patient.    Past Medical History  Diagnosis Date  . Back pain   . Depression   . Suicidal ideation   . Anxiety   . Panic     Past Surgical History  Procedure Laterality Date  . Back surgery    . Hernia repair    . Appendectomy    . Thumb fusion    . Nasal septum surgery      History reviewed. No pertinent family history.  History  Substance Use Topics  . Smoking status: Current Every Day Smoker    Types: Cigarettes  . Smokeless tobacco: Not on file  . Alcohol Use: No      Review of Systems  Constitutional: Negative for fever.  Respiratory: Negative for shortness of breath.   Cardiovascular: Negative for chest pain and leg swelling.  Gastrointestinal: Negative for abdominal pain, constipation and abdominal distention.  Genitourinary: Negative for dysuria, urgency, frequency, flank pain and difficulty urinating.  Musculoskeletal: Positive for back pain. Negative for joint swelling and gait problem.  Skin: Negative for rash.  Neurological: Negative for weakness and numbness.    Allergies  Morphine and related  Home Medications   Current Outpatient Rx  Name  Route  Sig  Dispense  Refill  .  methocarbamol (ROBAXIN) 500 MG tablet   Oral   Take 2 tablets (1,000 mg total) by mouth 4 (four) times daily.   40 tablet   0   . naproxen (NAPROSYN) 500 MG tablet   Oral   Take 1 tablet (500 mg total) by mouth 2 (two) times daily.   30 tablet   0   . traMADol (ULTRAM) 50 MG tablet   Oral   Take 2 tablets (100 mg total) by mouth every 8 (eight) hours as needed for pain.   30 tablet   0     BP 116/70  Pulse 70  Temp(Src) 97.4 F (36.3 C) (Oral)  Resp 20  Ht 6\' 3"  (1.905 m)  Wt 240 lb (108.863 kg)  BMI 30 kg/m2  SpO2 99%  Physical Exam  Nursing note and vitals reviewed. Constitutional: He appears well-developed and well-nourished.  HENT:  Head: Normocephalic.  Eyes: Conjunctivae are normal.  Neck: Normal range of motion. Neck supple.  Cardiovascular: Normal rate and intact distal pulses.   Pedal pulses normal.  Pulmonary/Chest: Effort normal.  Abdominal: Soft. Bowel sounds are normal. He exhibits no distension and no mass.  Musculoskeletal: Normal range of motion. He exhibits tenderness. He exhibits no edema.       Lumbar back: He exhibits tenderness and bony tenderness. He exhibits no swelling, no edema and no spasm.  Back:  Neurological: He is alert. He has normal strength. He displays no atrophy and no tremor. No sensory deficit. Gait normal.  Reflex Scores:      Patellar reflexes are 2+ on the right side and 2+ on the left side.      Achilles reflexes are 2+ on the right side and 2+ on the left side. No strength deficit noted in hip and knee flexor and extensor muscle groups.  Ankle flexion and extension intact.  Skin: Skin is warm and dry.  Psychiatric: He has a normal mood and affect.    ED Course  Procedures (including critical care time)  Labs Reviewed - No data to display Dg Lumbar Spine Complete  10/15/2012  *RADIOLOGY REPORT*  Clinical Data: Fall.  Back pain.  Prior surgery.  LUMBAR SPINE - COMPLETE 4+ VIEW  Comparison: 05/02/2012 MR.   Findings: Prior fusion L4-5 and L5-S1.  No evidence of fracture or malalignment.  IMPRESSION: Prior fusion L4-5 and L5-S1.  No evidence of fracture or malalignment.   Original Report Authenticated By: Lacy Duverney, M.D.      1. Fall, initial encounter   2. Lumbar strain, initial encounter       MDM  Patients labs and/or radiological studies were viewed and considered during the medical decision making and disposition process.  Pt ws given toradol injection while here, as he is driving home.  He was prescribed robaxin, naproxen and oxycodone.  Encouraged ice therapy x 2 days, then add heat tx on day 3.  Rest,  Avoid lifting, bending, etc.  Referrals given for f/u care prn.  No neuro deficit on exam or by history to suggest emergent or surgical presentation.  Also discussed worsened sx that should prompt immediate re-evaluation including distal weakness, bowel/bladder retention/incontinence.              Burgess Amor, PA-C 10/15/12 1623  Burgess Amor, PA-C 10/15/12 1624

## 2012-10-16 NOTE — ED Provider Notes (Signed)
Medical screening examination/treatment/procedure(s) were performed by non-physician practitioner and as supervising physician I was immediately available for consultation/collaboration.  Yoshi Mancillas, MD 10/16/12 0739 

## 2013-02-13 ENCOUNTER — Encounter (HOSPITAL_COMMUNITY): Payer: Self-pay | Admitting: *Deleted

## 2013-02-13 ENCOUNTER — Emergency Department (HOSPITAL_COMMUNITY)
Admission: EM | Admit: 2013-02-13 | Discharge: 2013-02-13 | Disposition: A | Discharge status: Home / Self Care | Payer: Medicare Other | Attending: Emergency Medicine | Admitting: Emergency Medicine

## 2013-02-13 DIAGNOSIS — F17200 Nicotine dependence, unspecified, uncomplicated: Secondary | ICD-10-CM | POA: Insufficient documentation

## 2013-02-13 DIAGNOSIS — IMO0002 Reserved for concepts with insufficient information to code with codable children: Secondary | ICD-10-CM | POA: Insufficient documentation

## 2013-02-13 DIAGNOSIS — G8929 Other chronic pain: Secondary | ICD-10-CM | POA: Insufficient documentation

## 2013-02-13 DIAGNOSIS — Z8659 Personal history of other mental and behavioral disorders: Secondary | ICD-10-CM | POA: Insufficient documentation

## 2013-02-13 DIAGNOSIS — M5416 Radiculopathy, lumbar region: Secondary | ICD-10-CM

## 2013-02-13 LAB — CBC WITH DIFFERENTIAL/PLATELET
Lymphocytes Relative: 29 % (ref 12–46)
Lymphs Abs: 2.5 10*3/uL (ref 0.7–4.0)
MCV: 94.5 fL (ref 78.0–100.0)
Neutrophils Relative %: 54 % (ref 43–77)
Platelets: 224 10*3/uL (ref 150–400)
RBC: 4.73 MIL/uL (ref 4.22–5.81)
WBC: 8.6 10*3/uL (ref 4.0–10.5)

## 2013-02-13 LAB — URINE MICROSCOPIC-ADD ON

## 2013-02-13 LAB — URINALYSIS, ROUTINE W REFLEX MICROSCOPIC
Glucose, UA: NEGATIVE mg/dL
Leukocytes, UA: NEGATIVE
pH: 6 (ref 5.0–8.0)

## 2013-02-13 MED ORDER — METHOCARBAMOL 500 MG PO TABS: 1000.0000 mg | ORAL_TABLET | Freq: Four times a day (QID) | ORAL | Status: AC

## 2013-02-13 MED ORDER — PREDNISONE 10 MG PO TABS: ORAL_TABLET | ORAL | Status: DC

## 2013-02-13 MED ORDER — ONDANSETRON 8 MG PO TBDP
8.0000 mg | ORAL_TABLET | Freq: Once | ORAL | Status: AC
  Administered 2013-02-13: 8 mg via ORAL
  Filled 2013-02-13: qty 1

## 2013-02-13 MED ORDER — HYDROMORPHONE HCL PF 1 MG/ML IJ SOLN
1.0000 mg | Freq: Once | INTRAMUSCULAR | Status: AC
  Administered 2013-02-13: 1 mg via INTRAMUSCULAR
  Filled 2013-02-13: qty 1

## 2013-02-13 NOTE — ED Notes (Signed)
Low back and bil hips pain since yesterday,No injury , Had MRI done at [facility].

## 2013-02-13 NOTE — ED Provider Notes (Signed)
Medical screening examination/treatment/procedure(s) were performed by non-physician practitioner and as supervising physician I was immediately available for consultation/collaboration.   Cedricka Sackrider L Yu Cragun, MD 02/13/13 2147 

## 2013-02-13 NOTE — ED Provider Notes (Signed)
History    CSN: [id number] Arrival date & time 02/13/13  1652  First MD Initiated Contact with Patient 02/13/13 1714     Chief Complaint  Patient presents with  . Back Pain   (Consider location/radiation/quality/duration/timing/severity/associated sxs/prior Treatment) HPI Comments: Frank Mills is a 52 y.o. Male presenting with acute low back pain which started 2 days ago,  Stating he woke with pain in his bilateral lower back,  Despite no known new injury.  He has a history of low back pain with lumbar diskectomy in 2002 by Dr Channing Mutters.  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  He was seen at [facility] yesterday where he underwent an MRI which was negative for any surgical findings, although he does have a small new disc herniation.  The MRI was evaluated by Dr. Channing Mutters and he recommended when necessary office followup when he was contacted by the emergency room physician at [facility].  Patient has had no fevers or chills, nausea, flulike symptoms, headache, neck pain or stiffness, no photophobia or visual changes.  He has pain that radiates into his bilateral upper thighs.  He has had no numbness or weakness in his extremities and no urinary or bowel incontinence or retention.  He was prescribed tramadol yesterday but he did not fill this as this medicine does not help his pain.     The history is provided by the patient.   Past Medical History  Diagnosis Date  . Back pain   . Depression   . Suicidal ideation   . Anxiety   . Panic    Past Surgical History  Procedure Laterality Date  . Back surgery    . Hernia repair    . Appendectomy    . Thumb fusion    . Nasal septum surgery     No family history on file. History  Substance Use Topics  . Smoking status: Current Every Day Smoker    Types: Cigarettes  .  Smokeless tobacco: Not on file  . Alcohol Use: No    Review of Systems  Constitutional: Negative for fever.  Respiratory: Negative for shortness of breath.   Cardiovascular: Negative for chest pain and leg swelling.  Gastrointestinal: Negative for abdominal pain, constipation and abdominal distention.  Genitourinary: Negative for dysuria, urgency, frequency, flank pain and difficulty urinating.  Musculoskeletal: Positive for back pain. Negative for joint swelling and gait problem.  Skin: Negative for rash.  Neurological: Negative for weakness and numbness.    Allergies  Morphine and related  Home Medications   Current Outpatient Rx  Name  Route  Sig  Dispense  Refill  . methocarbamol (ROBAXIN) 500 MG tablet   Oral   Take 2 tablets (1,000 mg total) by mouth 4 (four) times daily.   40 tablet   0   . predniSONE (DELTASONE) 10 MG tablet      6, 5, 4, 3, 2 then 1 tablet by mouth daily for 6 days total.   21 tablet   0    BP 136/80  Pulse 62  Temp(Src) 98.2 F (36.8 C) (Oral)  Resp 18  Ht 6\' 3"  (1.905 m)  Wt 240 lb (108.863 kg)  BMI 30 kg/m2  SpO2 98% Physical Exam  Nursing note and vitals reviewed. Constitutional: He appears well-developed and well-nourished.  HENT:  Head: Normocephalic.  Eyes: Conjunctivae are normal.  Neck: Normal range of motion. Neck supple.  Cardiovascular: Normal rate and intact distal pulses.   Pedal pulses normal.  Pulmonary/Chest: Effort normal.  Abdominal: Soft. Bowel sounds are normal. He exhibits no distension and no mass.  Musculoskeletal: Normal range of motion. He exhibits no edema.       Lumbar back: He exhibits tenderness. He exhibits no swelling, no edema and no spasm.  Mid lumbar tenderness to palpation along with bilateral lumbar muscular tenderness without spasm.  No edema, erythema.  Neurological: He is alert. He has normal strength. He displays no atrophy and no tremor. No sensory deficit. Gait normal.  Reflex Scores:       Patellar reflexes are 2+ on the right side and 2+ on the left side.      Achilles reflexes are 2+ on the right side and 2+ on the left side. No strength deficit noted in hip and knee flexor and extensor muscle groups.  Ankle flexion and extension intact.  Skin: Skin is warm and dry.  Psychiatric: He has a normal mood and affect.    ED Course  Procedures (including critical care time) Labs Reviewed  CBC WITH DIFFERENTIAL - Abnormal; Notable for the following:    Eosinophils Relative 8 (*)    All other components within normal limits  URINALYSIS, ROUTINE W REFLEX MICROSCOPIC - Abnormal; Notable for the following:    Hgb urine dipstick TRACE (*)    All other components within normal limits  URINE MICROSCOPIC-ADD ON   No results found. 1. Lumbar radiculopathy, chronic     MDM   MRI from yesterday's [facility] hospital visit was reviewed.  Prior hospitalization records from 2002 also reviewed.  Labs from today's visit were also reviewed with the normal WBC count.  Patient has no symptoms suggestive of the spinal cord infection and no risk factors for this today, no recent injections.  He denies IV drug use.  Encouraged followup with Dr. Channing Mutters.  Also encouraged continuing on his Endocet, will add anti-inflammatory and a muscle relaxer to this regimen.  No neuro deficit on exam or by history to suggest emergent or surgical presentation.  Also discussed worsened sx that should prompt immediate re-evaluation including distal weakness, bowel/bladder retention/incontinence.         [place] controlled substance database reviewed. Pt is receiving 30 day supply of endocet 10-325 ,  Last filled 02/05/13 in [place].    Burgess Amor, PA-C 02/13/13 1929

## 2013-02-13 NOTE — ED Notes (Signed)
Woke up with back pain two days ago. Pain to lower back. Hx of chronic back pain. Was seen at [facility] yesterday and had a MRI. Pt states he is able to stand and walk but with severe pain. MRI showed bulging disc. Pt was given a Rx of Tramadol he did not get if filled. Stated he has tried it before and it didn't do any good. 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

## 2015-02-04 IMAGING — CR DG LUMBAR SPINE COMPLETE 4+V
5 series · 5 of 5 positions shown · non-contrast
Comparison: 05/02/2012 MR.

CLINICAL DATA: Fall.  Back pain.  Prior surgery.

LUMBAR SPINE - COMPLETE 4+ VIEW

[view not recorded (1 of 5)]
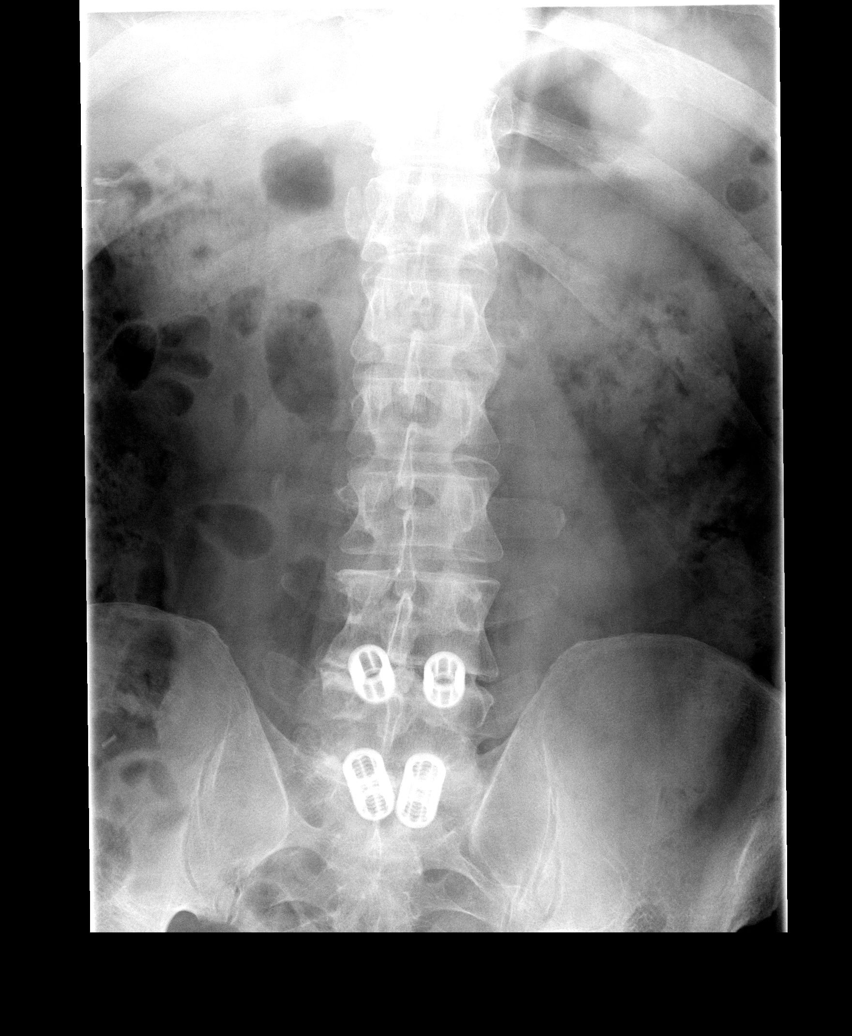

[view not recorded (2 of 5)]
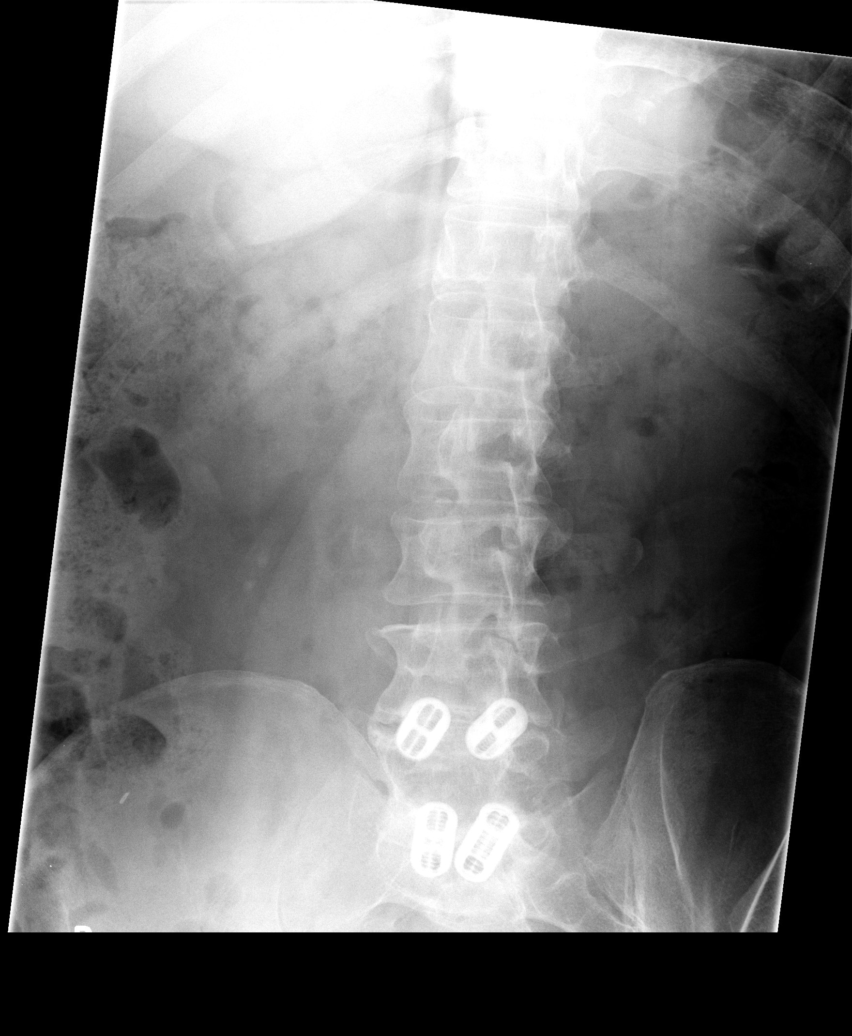

[view not recorded (3 of 5)]
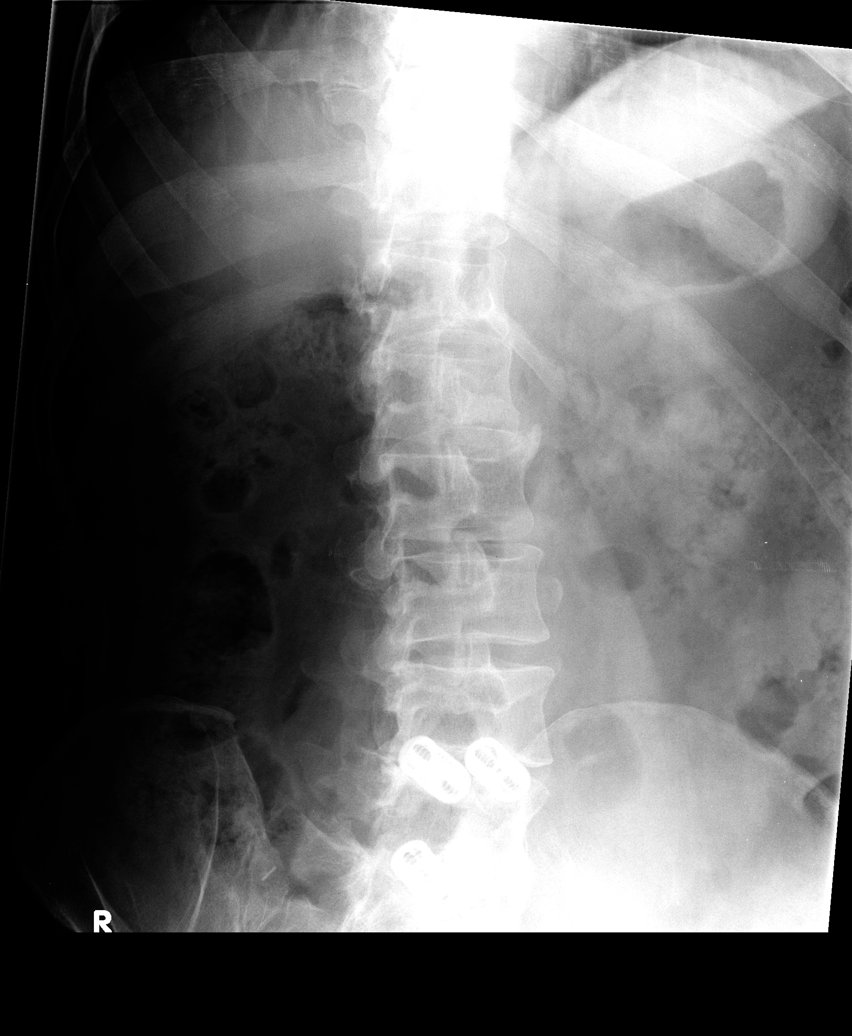

[view not recorded (4 of 5)]
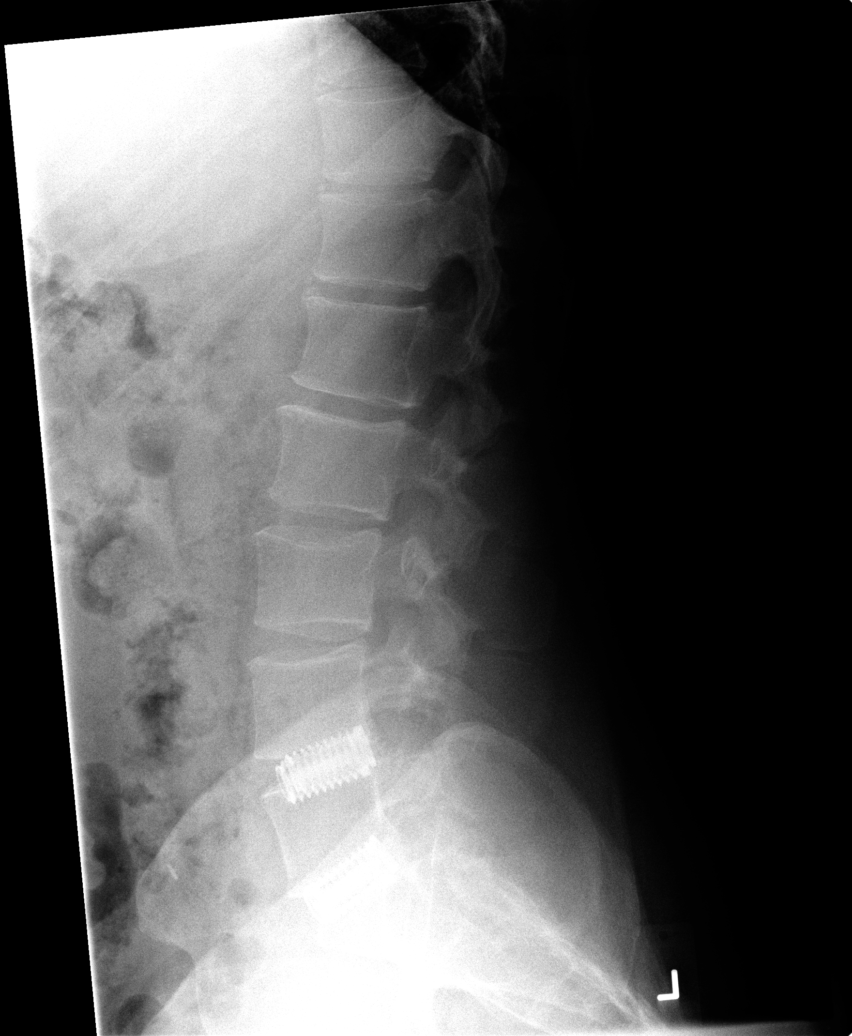

[view not recorded (5 of 5)]
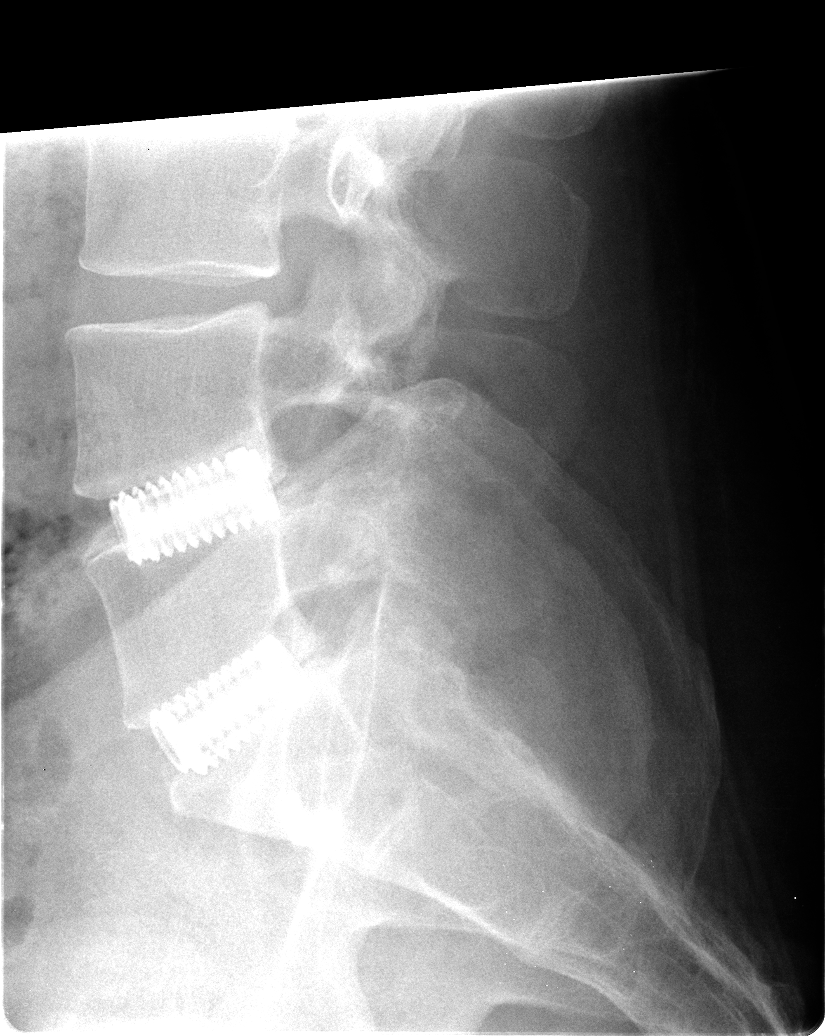

[5 of 5 positions shown; findings below may reference images not displayed]

FINDINGS: Prior fusion L4-5 and L5-S1.  No evidence of fracture or
malalignment.
IMPRESSION: Prior fusion L4-5 and L5-S1.

No evidence of fracture or malalignment.

## 2015-03-07 ENCOUNTER — Emergency Department (HOSPITAL_COMMUNITY)
Admission: EM | Admit: 2015-03-07 | Discharge: 2015-03-07 | Disposition: A | Discharge status: Home / Self Care | Payer: Commercial Managed Care - HMO | Attending: Emergency Medicine | Admitting: Emergency Medicine

## 2015-03-07 ENCOUNTER — Emergency Department (HOSPITAL_COMMUNITY): Payer: Commercial Managed Care - HMO

## 2015-03-07 ENCOUNTER — Encounter (HOSPITAL_COMMUNITY): Payer: Self-pay | Admitting: Emergency Medicine

## 2015-03-07 DIAGNOSIS — Z720 Tobacco use: Secondary | ICD-10-CM | POA: Diagnosis not present

## 2015-03-07 DIAGNOSIS — M79672 Pain in left foot: Secondary | ICD-10-CM | POA: Diagnosis present

## 2015-03-07 DIAGNOSIS — M7732 Calcaneal spur, left foot: Secondary | ICD-10-CM | POA: Insufficient documentation

## 2015-03-07 DIAGNOSIS — F320 Major depressive disorder, single episode, mild: Secondary | ICD-10-CM | POA: Insufficient documentation

## 2015-03-07 DIAGNOSIS — F410 Panic disorder [episodic paroxysmal anxiety] without agoraphobia: Secondary | ICD-10-CM | POA: Insufficient documentation

## 2015-03-07 DIAGNOSIS — M722 Plantar fascial fibromatosis: Secondary | ICD-10-CM | POA: Diagnosis not present

## 2015-03-07 MED ORDER — HYDROCODONE-ACETAMINOPHEN 5-325 MG PO TABS: 1.0000 | ORAL_TABLET | ORAL | Status: AC | PRN

## 2015-03-07 MED ORDER — NAPROXEN 500 MG PO TABS: 500.0000 mg | ORAL_TABLET | Freq: Two times a day (BID) | ORAL | Status: AC

## 2015-03-07 NOTE — ED Provider Notes (Signed)
CSN: [id number]     Arrival date & time 03/07/15  1836 History   First MD Initiated Contact with Patient 03/07/15 1841     Chief Complaint  Patient presents with  . Foot Pain     (Consider location/radiation/quality/duration/timing/severity/associated sxs/prior Treatment) The history is provided by the patient.   Frank Mills is a 54 y.o. male presenting with a 1 week history of left heel pain, describing foreign body sensation with attempts to apply full weight on his left heel, such as stepping on a pebble.  He denies any injury at the time the symptoms began.  He has avoided stepping on his heel by walking on the ball of his foot and now endorses mild posterior ankle pain as well, but suspects this is due to the altered gait.  He has applied ice without relief of symptoms. He has found no alleviators.     Past Medical History  Diagnosis Date  . Back pain   . Depression   . Suicidal ideation   . Anxiety   . Panic    Past Surgical History  Procedure Laterality Date  . Back surgery    . Hernia repair    . Appendectomy    . Thumb fusion    . Nasal septum surgery     History reviewed. No pertinent family history. History  Substance Use Topics  . Smoking status: Current Every Day Smoker    Types: Cigarettes  . Smokeless tobacco: Not on file  . Alcohol Use: No    Review of Systems  Constitutional: Negative for fever.  Musculoskeletal: Positive for joint swelling and arthralgias. Negative for myalgias.  Neurological: Negative for weakness and numbness.      Allergies  Morphine and related  Home Medications   Prior to Admission medications   Medication Sig Start Date End Date Taking? Authorizing Provider  citalopram (CELEXA) 20 MG tablet Take 20 mg by mouth at bedtime.  02/20/15  Yes Historical Provider, MD  HYDROcodone-acetaminophen (NORCO/VICODIN) 5-325 MG per tablet Take 1 tablet by mouth every 4 (four) hours as needed. 03/07/15   Burgess Amor, PA-C  naproxen  (NAPROSYN) 500 MG tablet Take 1 tablet (500 mg total) by mouth 2 (two) times daily. 03/07/15   Burgess Amor, PA-C   BP 122/82 mmHg  Pulse 103  Temp(Src) 97.9 F (36.6 C) (Oral)  Resp 20  Ht 6\' 3"  (1.905 m)  Wt 225 lb (102.059 kg)  BMI 28.12 kg/m2  SpO2 98% Physical Exam  Constitutional: He appears well-developed and well-nourished.  HENT:  Head: Atraumatic.  Neck: Normal range of motion.  Cardiovascular:  Pulses:      Dorsalis pedis pulses are 2+ on the right side, and 2+ on the left side.  Pulses equal bilaterally  Musculoskeletal: He exhibits tenderness.       Left foot: There is bony tenderness. There is normal range of motion, no swelling, normal capillary refill, no crepitus and no deformity.       Feet:  Slight increased pain along plantar foot with  toe dorsiflexion.   Neurological: He is alert. He has normal strength. He displays normal reflexes. No sensory deficit.  Skin: Skin is warm and dry.  Psychiatric: He has a normal mood and affect.    ED Course  Procedures (including critical care time) Labs Review Labs Reviewed - No data to display  Imaging Review Dg Foot Complete Left  03/07/2015   CLINICAL DATA:  54 year old male with a history of left foot  pain. No known trauma.  EXAM: LEFT FOOT - COMPLETE 3+ VIEW  COMPARISON:  None.  FINDINGS: No acute fracture. No significant soft tissue swelling. Hallux valgus deformity. Degenerative changes of the midfoot and of the hindfoot. No radiopaque foreign body.  IMPRESSION: No acute bony abnormality identified.  Mild degenerative changes.  Hallux valgus deformity.  Signed,  Yvone Neu. Loreta Ave, DO  [facility]  [facility]   Electronically Signed   By: Gilmer Mor D.O.   On: 03/07/2015 20:52     EKG Interpretation None      MDM   Final diagnoses:  Heel spur, left  Plantar fasciitis    Patients labs and/or radiological studies were reviewed and considered during the medical  decision making and disposition process.   Imaging was reviewed, interpreted and I agree with radiologists reading.  Results were also discussed with patient. Pt prescribe naproxen, a few hydrocodone, discussed plantar fascia shoe inserts.  Heat tx.  F/u with ortho prn, referral given.  The patient appears reasonably screened and/or stabilized for discharge and I doubt any other medical condition or other [facility] requiring further screening, evaluation, or treatment in the ED at this time prior to discharge.     Burgess Amor, PA-C 03/08/15 0110  Benjiman Core, MD 03/08/15 [phone]

## 2015-03-07 NOTE — Discharge Instructions (Signed)
Heel Spur A heel spur is a hook of bone that can form on the calcaneus (the heel bone and the largest bone of the foot). Heel spurs are often associated with plantar fasciitis and usually come in people who have had the problem for an extended period of time. The cause of the relationship is unknown. The pain associated with them is thought to be caused by an inflammation (soreness and redness) of the plantar fascia rather than the spur itself. The plantar fascia is a thick fibrous like tissue that runs from the calcaneus (heel bone) to the ball of the foot. This strong, tight tissue helps maintain the arch of your foot. It helps distribute the weight across your foot as you walk or run. Stresses placed on the plantar fascia can be tremendous. When it is inflamed normal activities become painful. Pain is worse in the morning after sleeping. After sleeping the plantar fascia is tight. The first movements stretch the fascia and this causes pain. As the tendon loosens, the pain usually gets better. It often returns with too much standing or walking.  About 70% of patients with plantar fasciitis have a heel spur. About half of people without foot pain also have heel spurs. DIAGNOSIS  The diagnosis of a heel spur is made by X-ray. The X-ray shows a hook of bone protruding from the bottom of the calcaneus at the point where the plantar fascia is attached to the heel bone.  TREATMENT  It is necessary to find out what is causing the stretching of the plantar fascia. If the cause is over-pronation (flat feet), orthotics and proper foot ware may help.  Stretching exercises, losing weight, wearing shoes that have a cushioned heel that absorbs shock, and elevating the heel with the use of a heel cradle, heel cup, or orthotics may all help. Heel cradles and heel cups provide extra comfort and cushion to the heel, and reduce the amount of shock to the sore area. AVOIDING THE PAIN OF PLANTAR FASCIITIS AND HEEL  SPURS  Consult a sports medicine professional before beginning a new exercise program.  Walking programs offer a good workout. There is a lower chance of overuse injuries common to the runners. There is less impact and less jarring of the joints.  Begin all new exercise programs slowly. If problems or pains develop, decrease the amount of time or distance until you are at a comfortable level.  Wear good shoes and replace them regularly.  Stretch your foot and the heel cords at the back of the ankle (Achilles tendons) both before and after exercise.  Run or exercise on even surfaces that are not hard. For example, asphalt is better than pavement.  Do not run barefoot on hard surfaces.  If using a treadmill, vary the incline.  Do not continue to workout if you have foot or joint problems. Seek professional help if they do not improve. HOME CARE INSTRUCTIONS   Avoid activities that cause you pain until you recover.  Use ice or cold packs to the problem or painful areas after working out.  Only take over-the-counter or prescription medicines for pain, discomfort, or fever as directed by your caregiver.  Soft shoe inserts or athletic shoes with air or gel sole cushions may be helpful.  If problems continue or become more severe, consult a sports medicine caregiver. Cortisone is a potent anti-inflammatory medication that may be injected into the painful area. You can discuss this treatment with your caregiver. MAKE SURE YOU:     Understand these instructions.  Will watch your condition.  Will get help right away if you are not doing well or get worse. Document Released: 08/24/2005 Document Revised: 10/10/2011 Document Reviewed: 09/18/2013 Exit[employer] Patient Information 2015 [employer]. This information is not intended to replace advice given to you by your health care provider. Make sure you discuss any questions you have with your health care provider.  

## 2015-03-07 NOTE — ED Notes (Signed)
Pain to left heel for last 7 days.  Pain has increased.  Rates pain 10/10 when walking on heel.  Denies injury.

## 2016-12-07 DIAGNOSIS — H5712 Ocular pain, left eye: Secondary | ICD-10-CM | POA: Diagnosis not present

## 2016-12-07 DIAGNOSIS — H18822 Corneal disorder due to contact lens, left eye: Secondary | ICD-10-CM | POA: Diagnosis not present

## 2016-12-08 DIAGNOSIS — H16292 Other keratoconjunctivitis, left eye: Secondary | ICD-10-CM | POA: Diagnosis not present

## 2017-06-26 IMAGING — DX DG FOOT COMPLETE 3+V*L*
3 series · 3 of 3 positions shown · non-contrast
Comparison: None.

CLINICAL DATA: 54-year-old male with a history of left foot pain.
No known trauma.

EXAM:
LEFT FOOT - COMPLETE 3+ VIEW

[foot ap]
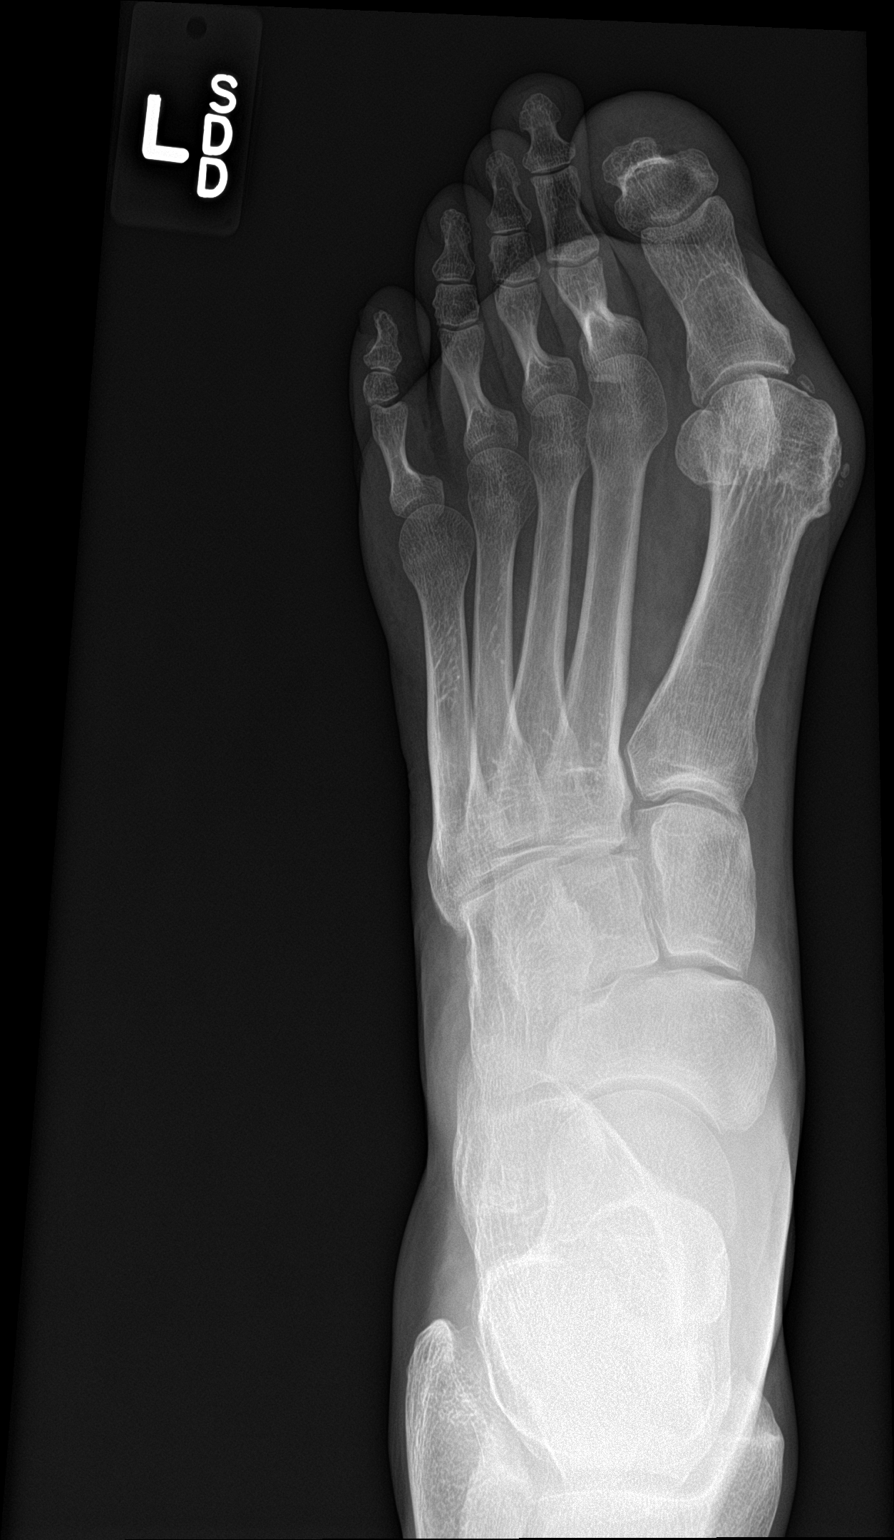

[foot obl]
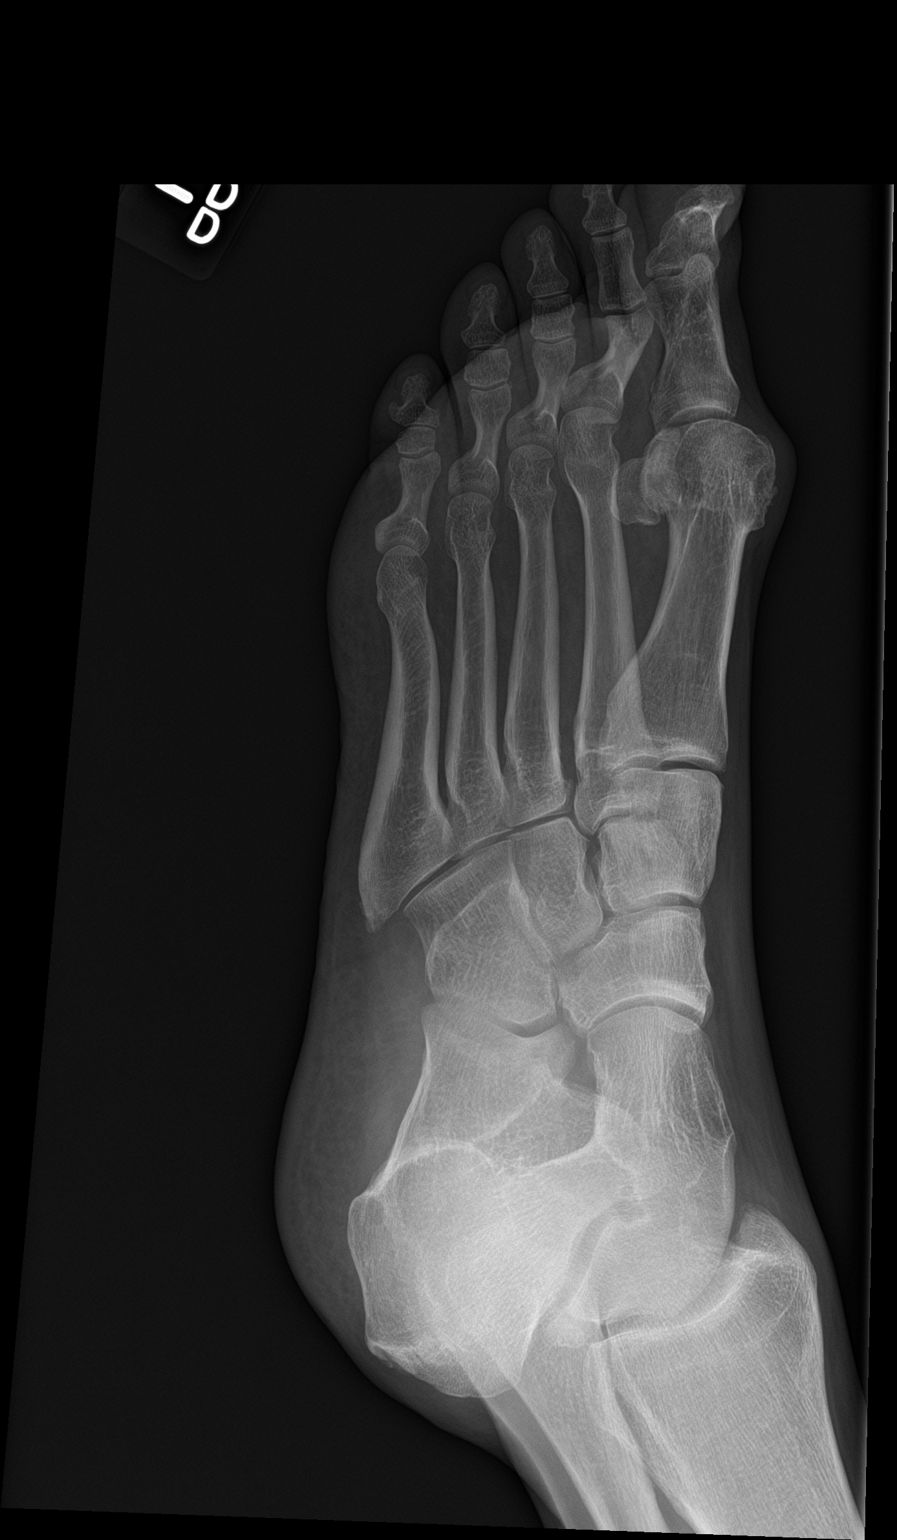

[foot lat]
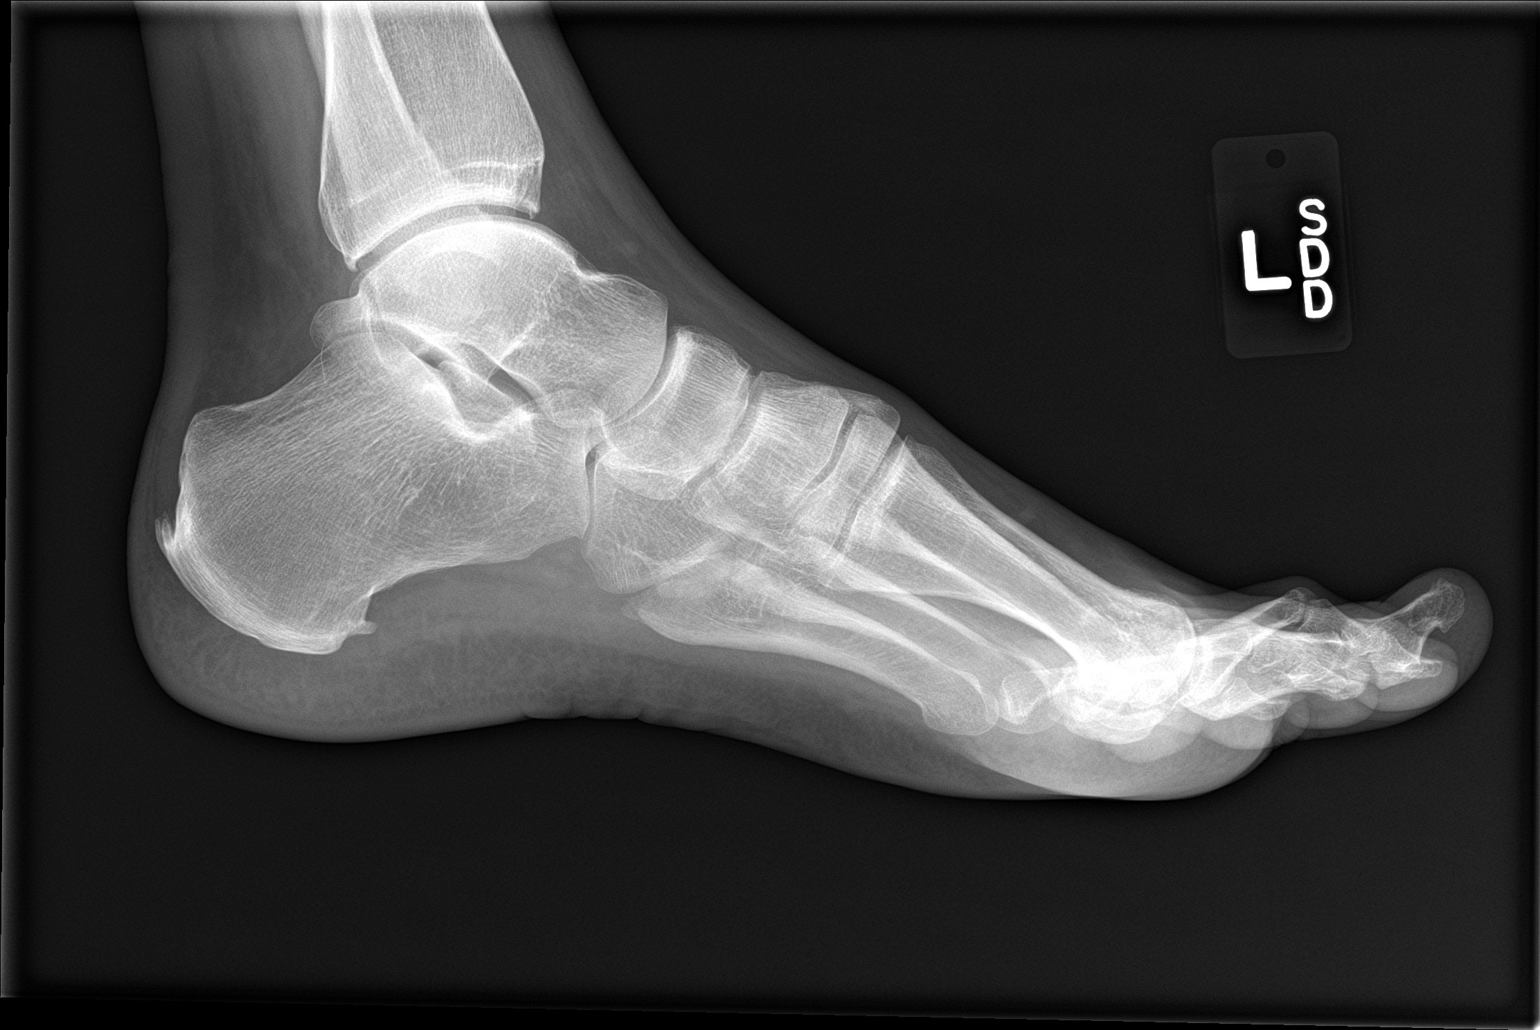

[3 of 3 positions shown; findings below may reference images not displayed]

FINDINGS: No acute fracture. No significant soft tissue swelling. Hallux
valgus deformity. Degenerative changes of the midfoot and of the
hindfoot. No radiopaque foreign body.
IMPRESSION: No acute bony abnormality identified.

Mild degenerative changes.

Hallux valgus deformity.

## 2017-09-21 DIAGNOSIS — H16001 Unspecified corneal ulcer, right eye: Secondary | ICD-10-CM | POA: Diagnosis not present

## 2018-01-14 DIAGNOSIS — Z87442 Personal history of urinary calculi: Secondary | ICD-10-CM | POA: Diagnosis not present

## 2018-01-14 DIAGNOSIS — K4090 Unilateral inguinal hernia, without obstruction or gangrene, not specified as recurrent: Secondary | ICD-10-CM | POA: Diagnosis not present

## 2018-01-14 DIAGNOSIS — R109 Unspecified abdominal pain: Secondary | ICD-10-CM | POA: Diagnosis not present

## 2018-01-14 DIAGNOSIS — K7689 Other specified diseases of liver: Secondary | ICD-10-CM | POA: Diagnosis not present

## 2018-01-14 DIAGNOSIS — R300 Dysuria: Secondary | ICD-10-CM | POA: Diagnosis not present

## 2018-02-16 ENCOUNTER — Ambulatory Visit: Payer: Self-pay | Admitting: Urology

## 2018-02-27 DIAGNOSIS — R109 Unspecified abdominal pain: Secondary | ICD-10-CM | POA: Diagnosis not present

## 2018-02-27 DIAGNOSIS — M25551 Pain in right hip: Secondary | ICD-10-CM | POA: Diagnosis not present

## 2018-02-27 DIAGNOSIS — R05 Cough: Secondary | ICD-10-CM | POA: Diagnosis not present

## 2018-02-27 DIAGNOSIS — R1084 Generalized abdominal pain: Secondary | ICD-10-CM | POA: Diagnosis not present

## 2018-02-27 DIAGNOSIS — K4030 Unilateral inguinal hernia, with obstruction, without gangrene, not specified as recurrent: Secondary | ICD-10-CM | POA: Diagnosis not present

## 2018-03-30 DIAGNOSIS — H52223 Regular astigmatism, bilateral: Secondary | ICD-10-CM | POA: Diagnosis not present

## 2018-03-30 DIAGNOSIS — H5213 Myopia, bilateral: Secondary | ICD-10-CM | POA: Diagnosis not present

## 2018-03-30 DIAGNOSIS — H524 Presbyopia: Secondary | ICD-10-CM | POA: Diagnosis not present

## 2018-04-17 ENCOUNTER — Ambulatory Visit: Payer: Self-pay | Admitting: Urology

## 2018-09-30 DIAGNOSIS — Z7952 Long term (current) use of systemic steroids: Secondary | ICD-10-CM | POA: Diagnosis not present

## 2018-09-30 DIAGNOSIS — J449 Chronic obstructive pulmonary disease, unspecified: Secondary | ICD-10-CM | POA: Diagnosis not present

## 2018-09-30 DIAGNOSIS — M25511 Pain in right shoulder: Secondary | ICD-10-CM | POA: Diagnosis not present

## 2018-09-30 DIAGNOSIS — Z79899 Other long term (current) drug therapy: Secondary | ICD-10-CM | POA: Diagnosis not present

## 2018-09-30 DIAGNOSIS — G8929 Other chronic pain: Secondary | ICD-10-CM | POA: Diagnosis not present

## 2018-10-23 DIAGNOSIS — M7541 Impingement syndrome of right shoulder: Secondary | ICD-10-CM | POA: Diagnosis not present

## 2018-12-04 DIAGNOSIS — M7540 Impingement syndrome of unspecified shoulder: Secondary | ICD-10-CM | POA: Diagnosis not present

## 2018-12-04 DIAGNOSIS — M5412 Radiculopathy, cervical region: Secondary | ICD-10-CM | POA: Diagnosis not present

## 2018-12-07 DIAGNOSIS — M67843 Other specified disorders of tendon, right hand: Secondary | ICD-10-CM | POA: Diagnosis not present

## 2018-12-07 DIAGNOSIS — M5412 Radiculopathy, cervical region: Secondary | ICD-10-CM | POA: Diagnosis not present

## 2018-12-07 DIAGNOSIS — M50121 Cervical disc disorder at C4-C5 level with radiculopathy: Secondary | ICD-10-CM | POA: Diagnosis not present

## 2018-12-07 DIAGNOSIS — M5011 Cervical disc disorder with radiculopathy,  high cervical region: Secondary | ICD-10-CM | POA: Diagnosis not present

## 2018-12-11 DIAGNOSIS — M7541 Impingement syndrome of right shoulder: Secondary | ICD-10-CM | POA: Diagnosis not present

## 2018-12-21 DIAGNOSIS — M7540 Impingement syndrome of unspecified shoulder: Secondary | ICD-10-CM | POA: Diagnosis not present

## 2018-12-21 DIAGNOSIS — M5412 Radiculopathy, cervical region: Secondary | ICD-10-CM | POA: Diagnosis not present

## 2019-01-22 DIAGNOSIS — M5412 Radiculopathy, cervical region: Secondary | ICD-10-CM | POA: Diagnosis not present

## 2019-02-26 DIAGNOSIS — K760 Fatty (change of) liver, not elsewhere classified: Secondary | ICD-10-CM | POA: Diagnosis not present

## 2019-02-26 DIAGNOSIS — R1032 Left lower quadrant pain: Secondary | ICD-10-CM | POA: Diagnosis not present

## 2019-02-26 DIAGNOSIS — R1031 Right lower quadrant pain: Secondary | ICD-10-CM | POA: Diagnosis not present

## 2019-02-26 DIAGNOSIS — R1030 Lower abdominal pain, unspecified: Secondary | ICD-10-CM | POA: Diagnosis not present

## 2019-02-26 DIAGNOSIS — K4090 Unilateral inguinal hernia, without obstruction or gangrene, not specified as recurrent: Secondary | ICD-10-CM | POA: Diagnosis not present

## 2019-04-09 DIAGNOSIS — E7800 Pure hypercholesterolemia, unspecified: Secondary | ICD-10-CM | POA: Diagnosis not present

## 2019-04-09 DIAGNOSIS — R079 Chest pain, unspecified: Secondary | ICD-10-CM | POA: Diagnosis not present

## 2019-04-09 DIAGNOSIS — I10 Essential (primary) hypertension: Secondary | ICD-10-CM | POA: Diagnosis not present

## 2019-04-09 DIAGNOSIS — R1111 Vomiting without nausea: Secondary | ICD-10-CM | POA: Diagnosis not present

## 2019-04-09 DIAGNOSIS — R001 Bradycardia, unspecified: Secondary | ICD-10-CM | POA: Diagnosis not present

## 2019-09-11 DIAGNOSIS — Z20822 Contact with and (suspected) exposure to covid-19: Secondary | ICD-10-CM | POA: Diagnosis not present

## 2020-01-22 DIAGNOSIS — Z79899 Other long term (current) drug therapy: Secondary | ICD-10-CM | POA: Diagnosis not present

## 2020-01-22 DIAGNOSIS — J449 Chronic obstructive pulmonary disease, unspecified: Secondary | ICD-10-CM | POA: Diagnosis not present

## 2020-01-22 DIAGNOSIS — R109 Unspecified abdominal pain: Secondary | ICD-10-CM | POA: Diagnosis not present

## 2020-01-22 DIAGNOSIS — M1990 Unspecified osteoarthritis, unspecified site: Secondary | ICD-10-CM | POA: Diagnosis not present

## 2020-01-22 DIAGNOSIS — M5441 Lumbago with sciatica, right side: Secondary | ICD-10-CM | POA: Diagnosis not present

## 2020-01-22 DIAGNOSIS — Z7952 Long term (current) use of systemic steroids: Secondary | ICD-10-CM | POA: Diagnosis not present

## 2020-01-22 DIAGNOSIS — R1111 Vomiting without nausea: Secondary | ICD-10-CM | POA: Diagnosis not present

## 2021-12-28 DIAGNOSIS — M7581 Other shoulder lesions, right shoulder: Secondary | ICD-10-CM | POA: Diagnosis not present

## 2022-07-31 DIAGNOSIS — K219 Gastro-esophageal reflux disease without esophagitis: Secondary | ICD-10-CM | POA: Diagnosis not present

## 2022-07-31 DIAGNOSIS — Z743 Need for continuous supervision: Secondary | ICD-10-CM | POA: Diagnosis not present

## 2022-07-31 DIAGNOSIS — R079 Chest pain, unspecified: Secondary | ICD-10-CM | POA: Diagnosis not present

## 2022-07-31 DIAGNOSIS — R110 Nausea: Secondary | ICD-10-CM | POA: Diagnosis not present

## 2022-07-31 DIAGNOSIS — R112 Nausea with vomiting, unspecified: Secondary | ICD-10-CM | POA: Diagnosis not present

## 2022-07-31 DIAGNOSIS — R1110 Vomiting, unspecified: Secondary | ICD-10-CM | POA: Diagnosis not present

## 2022-07-31 DIAGNOSIS — R0789 Other chest pain: Secondary | ICD-10-CM | POA: Diagnosis not present

## 2022-08-01 DIAGNOSIS — F17210 Nicotine dependence, cigarettes, uncomplicated: Secondary | ICD-10-CM | POA: Diagnosis not present

## 2022-08-01 DIAGNOSIS — B029 Zoster without complications: Secondary | ICD-10-CM | POA: Diagnosis not present

## 2022-08-01 DIAGNOSIS — R072 Precordial pain: Secondary | ICD-10-CM | POA: Diagnosis not present

## 2022-08-01 DIAGNOSIS — R079 Chest pain, unspecified: Secondary | ICD-10-CM | POA: Diagnosis not present

## 2023-09-02 DIAGNOSIS — M25511 Pain in right shoulder: Secondary | ICD-10-CM | POA: Diagnosis not present

## 2023-09-02 DIAGNOSIS — M19011 Primary osteoarthritis, right shoulder: Secondary | ICD-10-CM | POA: Diagnosis not present

## 2023-09-02 DIAGNOSIS — F17210 Nicotine dependence, cigarettes, uncomplicated: Secondary | ICD-10-CM | POA: Diagnosis not present

## 2023-09-02 DIAGNOSIS — Z885 Allergy status to narcotic agent status: Secondary | ICD-10-CM | POA: Diagnosis not present

## 2023-09-02 NOTE — ED Provider Notes (Signed)
 Emergency Department Provider Note    ED Clinical Impression   Final diagnoses:  Acute pain of right shoulder (Primary)  Osteoarthritis of right shoulder, unspecified osteoarthritis type    ED Assessment/Plan    Condition: Stable Disposition: Discharge  This chart has been completed using Dragon Medical Dictation software, and while attempts have been made to ensure accuracy, certain words and phrases may not be transcribed as intended.   History   Chief Complaint  Patient presents with  . Shoulder Pain   HPI  Frank Mills is a 63 y.o. male who presents today to the emergency department complaining of right shoulder pain for the last 2 days that his been getting worse.  The pain is intermittent, a 10 at its worst and aggravated by lying on it, lighting a cigarette or having his arm hanging down by his side while standing, but no alleviating factors have been identified.  Allergies: is allergic to opioids - morphine  analogues. Medications: is not on any long-term medications. PMHx:  has no past medical history on file. PSHx:  has a past surgical history that includes Back surgery and Appendectomy. SocHx:  reports that he has been smoking cigarettes. He started smoking about 31 years ago. He has a 15.5 pack-year smoking history. He does not have any smokeless tobacco history on file. He reports that he does not drink alcohol and does not use drugs. Allergies, Medications, Medical, Surgical, and Social History were reviewed as documented above.   Social Drivers of Health with Concerns   Food Insecurity: Not on file  Internet Connectivity: Not on file  Housing/Utilities: Not on file  Tobacco Use: High Risk (07/31/2022)   Patient History   . Smoking Tobacco Use: Every Day   . Smokeless Tobacco Use: Unknown   . Passive Exposure: Not on file  Transportation Needs: Not on file  Alcohol  Use: Not on file  Interpersonal Safety: Not on file  Physical Activity: Not on file  Intimate Partner Violence: Not on file  Stress: Not on file  Substance Use: Not on file (06/05/2023)  Social Connections: Not on file  Financial Resource Strain: Not on file  Depression: Not on file  Health Literacy: Not on file     Review Of Systems  Review of Systems  Constitutional:  Negative for chills and fever.  Respiratory:  Negative for shortness of breath.   Cardiovascular:  Negative for chest pain.  Gastrointestinal:  Negative for nausea and vomiting.  Musculoskeletal:        Right shoulder pain  All other systems reviewed and are negative.   Physical Exam   BP 115/77   Pulse 86   Temp 36.7 C (98.1 F) (Temporal)   Resp 18   Ht 190.5 cm (6' 3)   Wt 100 kg (220 lb 6.4 oz)   SpO2 100%   BMI 27.55 kg/m   Physical Exam Vitals and nursing note reviewed.  Constitutional:      General: He is not in acute distress.    Appearance: He is well-developed.  HENT:     Head: Normocephalic and atraumatic.  Cardiovascular:     Rate and Rhythm: Normal rate and regular rhythm.     Heart sounds: Normal heart sounds.  Pulmonary:     Effort: Pulmonary effort is normal.  Breath sounds: Normal breath sounds.  Musculoskeletal:     Right shoulder: Tenderness present. Normal strength. Normal pulse.  Neurological:     Mental Status: He is alert and oriented to person, place, and time.  Psychiatric:        Mood and Affect: Mood normal.        Behavior: Behavior normal.     ED Course  Medical Decision Making X-ray does not show any acute fractures or dislocations, but does show some advanced osteoarthritis in the [facility] joint.  Will treat with naproxen , stretches and ice; may add Tylenol  if/as needed.  Follow-up with PCP.  I have reviewed my clinical findings and my clinical impression with the patient. The patient has expressed understanding that at this time there is no evidence for a  more serious underlying process, but also understands that early in the process of a condition such as this, an initial workup can be falsely reassuring. I have counseled and discussed follow-up with the patient, stressing the importance of appropriate follow-up. I have also counseled the patient to return if symptoms worsen or if there are any concerns. Routine discharge counseling was given to the patient and the patient understands that worsening, changing or persistent symptoms should prompt an immediate call or follow up with their primary physician or return to the emergency department for reevaluation. Patient has expressed understanding.  Amount and/or Complexity of Data Reviewed Radiology: ordered. Decision-making details documented in ED Course.  Risk OTC drugs. Prescription drug management. Parenteral controlled substances. Diagnosis or treatment significantly limited by social determinants of health.     Procedures   No results found for this visit on 09/02/23 (from the past 4464 hours).   ED Results No results found for any visits on 09/02/23. No results found.   Medications Administered:  Medications  ketorolac  (TORADOL ) injection 60 mg (60 mg Intramuscular Given 09/02/23 1634)    Discharge Medications (Medications Prescribed during this  ED visit and Patient's Home Medications) :    Your Medication List     START taking these medications    naproxen  500 MG tablet Commonly known as: NAPROSYN  Take 1 tablet (500 mg total) by mouth in the morning and 1 tablet (500 mg total) in the evening. Take with meals. Do all this for 15 days.          Rockey Alyce Ditch, [place] 09/03/23 [phone]

## 2023-09-08 DIAGNOSIS — M7581 Other shoulder lesions, right shoulder: Secondary | ICD-10-CM | POA: Diagnosis not present

## 2023-09-15 DIAGNOSIS — M62511 Muscle wasting and atrophy, not elsewhere classified, right shoulder: Secondary | ICD-10-CM | POA: Diagnosis not present

## 2023-09-15 DIAGNOSIS — M75101 Unspecified rotator cuff tear or rupture of right shoulder, not specified as traumatic: Secondary | ICD-10-CM | POA: Diagnosis not present

## 2023-09-15 DIAGNOSIS — M19011 Primary osteoarthritis, right shoulder: Secondary | ICD-10-CM | POA: Diagnosis not present

## 2023-09-15 DIAGNOSIS — M25511 Pain in right shoulder: Secondary | ICD-10-CM | POA: Diagnosis not present

## 2023-09-19 DIAGNOSIS — M7581 Other shoulder lesions, right shoulder: Secondary | ICD-10-CM | POA: Diagnosis not present

## 2023-12-20 DIAGNOSIS — R1032 Left lower quadrant pain: Secondary | ICD-10-CM | POA: Diagnosis not present

## 2023-12-20 NOTE — Progress Notes (Signed)
 Frank Mills 63 y.o. Feb 09, 1961 Phone: [phone] (home)  Address: [address] [place] [address]  MRN: [id number] Primary MD : Referred Self  [facility]: Left inguinal hernia suspected on examination possibly indirect We will obtain ultrasound to confirm diagnosis Follow-up 1 week after ultrasound for results and to discuss treatment possibilities Avoid pulling pushing or lifting greater than 25 pounds in the meantime Patient will report to emergency department if signs or symptoms of incarceration, strangulation, or obstruction occur  Hernia Consultation Note  No past medical history on file.  There is no problem list on file for this patient.   Past Surgical History:  Procedure Laterality Date  . APPENDECTOMY    . BACK SURGERY      Allergies  Allergen Reactions  . Opioids - Morphine  Analogues Other (See Comments)    Decreases kidney function, nose itches    No current outpatient medications on file.   reports that he has been smoking cigarettes. He started smoking about 31 years ago. He has a 15.7 pack-year smoking history. He has never used smokeless tobacco. He reports that he does not drink alcohol and does not use drugs.  has no family status information on file.    Special Needs Assesment No special needs identified     History of Present Illness: Reason for Consult: Inguinal hernia, left Frank Mills is a pleasant 63 y.o. year old male who is seen in consultation for evaluation of a Inguinal hernia, left.  Duration 2-3 months Location: Left groin Associated symptoms: Swelling, no signs or symptoms of incarceration strangulation or obstruction present Aggravating factors: standing, moderate exertion, and cough Aleviating factors: supine position Severity: moderate Imaging: none Previous treatment: none  Review of Systems  Constitutional:  Negative for chills, fever, malaise/fatigue and weight loss.   HENT:  Negative for congestion and sore throat.   Eyes:  Negative for double vision, photophobia and redness.  Respiratory:  Negative for cough, hemoptysis, shortness of breath, wheezing and stridor.   Cardiovascular:  Negative for chest pain, palpitations, orthopnea, claudication and leg swelling.  Gastrointestinal:  Negative for abdominal pain, blood in stool, constipation, diarrhea, melena, nausea and vomiting.  Genitourinary:  Negative for frequency, hematuria and urgency.  Musculoskeletal:  Negative for joint pain.  Skin:  Negative for itching and rash.  Neurological:  Negative for dizziness, tremors, focal weakness, seizures, loss of consciousness, weakness and headaches.  Endo/Heme/Allergies:  Negative for polydipsia. Does not bruise/bleed easily.  Psychiatric/Behavioral:  Negative for memory loss and suicidal ideas. The patient does not have insomnia.    Vital signs: BP 115/77   Pulse 54   Temp 36.2 C (97.1 F)   Ht 190.5 cm (6' 3)   Wt 97.1 kg (214 lb)   BMI 26.75 kg/m   Physical Exam Constitutional:      General: He is not in acute distress.    Appearance: Normal appearance. He is well-developed. He is not ill-appearing, toxic-appearing or diaphoretic.  HENT:     Head: Normocephalic and atraumatic.     Right Ear: External ear normal.     Left Ear: External ear normal.     Nose: No congestion or rhinorrhea.     Mouth/Throat:     Mouth: Mucous membranes are moist.     Pharynx: No oropharyngeal exudate or posterior oropharyngeal erythema.  Eyes:     General: No scleral icterus.       Right eye: No discharge.  Left eye: No discharge.     Extraocular Movements: Extraocular movements intact.     Conjunctiva/sclera: Conjunctivae normal.  Neck:     Thyroid: No thyromegaly.     Vascular: No JVD.     Trachea: No tracheal deviation.  Cardiovascular:     Rate and Rhythm: Normal rate and regular rhythm.     Heart sounds: Normal heart sounds. No murmur heard.    No  friction rub. No gallop.  Pulmonary:     Effort: Pulmonary effort is normal. No respiratory distress.     Breath sounds: Normal breath sounds. No stridor. No wheezing, rhonchi or rales.  Chest:     Chest wall: No tenderness.  Abdominal:     General: Bowel sounds are normal. There is no distension.     Palpations: Abdomen is soft. There is no mass.     Tenderness: There is no abdominal tenderness. There is no right CVA tenderness, left CVA tenderness, guarding or rebound.     Hernia: No hernia is present.       Comments: Swelling noted left groin More pronounced when standing Suspect direct inguinal hernia Manually reducible Patient examined with aid of Valsalva maneuver  Musculoskeletal:        General: No tenderness or deformity. Normal range of motion.     Cervical back: Normal range of motion and neck supple. No rigidity or tenderness.  Lymphadenopathy:     Cervical: No cervical adenopathy.  Skin:    General: Skin is warm and dry.     Coloration: Skin is not pale.     Findings: No erythema or rash.  Neurological:     General: No focal deficit present.     Mental Status: He is alert and oriented to person, place, and time.     Cranial Nerves: No cranial nerve deficit.     Motor: No weakness.     Coordination: Coordination normal.     Gait: Gait normal.     Deep Tendon Reflexes: Reflexes are normal and symmetric.  Psychiatric:        Mood and Affect: Mood normal.        Behavior: Behavior normal.        Thought Content: Thought content normal.        Judgment: Judgment normal.     Lab Results  Component Value Date   WBC 11.9 (H) 07/31/2022   HGB 15.1 07/31/2022   HCT 43.3 07/31/2022   PLT 207 07/31/2022    Lab Results  Component Value Date   NA 141 07/31/2022   K 4.0 07/31/2022   CL 106 07/31/2022   CO2 25.0 07/31/2022   BUN 22 (H) 07/31/2022   CREATININE 1.10 07/31/2022   GLU 121 07/31/2022   CALCIUM 8.8 07/31/2022   MG 1.9 07/31/2022    Lab Results   Component Value Date   BILITOT 0.2 (L) 07/31/2022   PROT 6.5 07/31/2022   ALBUMIN 2.9 (L) 07/31/2022   ALT 26 07/31/2022   AST 15 07/31/2022   ALKPHOS 64 07/31/2022

## 2023-12-28 DIAGNOSIS — R1032 Left lower quadrant pain: Secondary | ICD-10-CM | POA: Diagnosis not present

## 2024-01-12 DIAGNOSIS — K6389 Other specified diseases of intestine: Secondary | ICD-10-CM | POA: Diagnosis not present

## 2024-01-12 DIAGNOSIS — N281 Cyst of kidney, acquired: Secondary | ICD-10-CM | POA: Diagnosis not present

## 2024-01-12 DIAGNOSIS — K7689 Other specified diseases of liver: Secondary | ICD-10-CM | POA: Diagnosis not present

## 2024-01-12 DIAGNOSIS — K4090 Unilateral inguinal hernia, without obstruction or gangrene, not specified as recurrent: Secondary | ICD-10-CM | POA: Diagnosis not present

## 2024-01-12 DIAGNOSIS — Z9049 Acquired absence of other specified parts of digestive tract: Secondary | ICD-10-CM | POA: Diagnosis not present

## 2024-01-17 DIAGNOSIS — K4090 Unilateral inguinal hernia, without obstruction or gangrene, not specified as recurrent: Secondary | ICD-10-CM | POA: Diagnosis not present

## 2024-01-17 NOTE — Telephone Encounter (Signed)
 Open left inguinal hernia repair 01/30/24 K40.90 [id number] [facility]

## 2024-01-17 NOTE — Progress Notes (Signed)
 ------------------------------------------------------------------------------- Summary: Left inguinal Hernia -------------------------------------------------------------------------------   Frank Mills 63 y.o. [birth year] Phone: [phone] (home)  Address: [address] [place] [address]  MRN: [id number] Primary MD : Referred Self  [facility]  Plan: We went over the risks and benefits, as well as surgical details, and he agrees to proceed with surgery. We will plan for open Inguinal hernia, left repair with mesh. The patient will be cleared by the medical service if necessary, prior to surgery.  We spent about 30 minutes today going over treatment options and counseling.  Counseling: Risks, benefits and alternatives of the proposed surgery were discussed with the patient and these included but were not limited to bleeding, infection, scar tissue, chronic pain, injury to nearby structures such as nearby vessels and nerves, bowel, liver, conversion to open technique, wound problems, heart and lung complications.  He wishes to proceed.   Hernia Consultation Note  No past medical history on file.  Patient Active Problem List  Diagnosis  . Groin pain, left    Past Surgical History:  Procedure Laterality Date  . APPENDECTOMY    . BACK SURGERY    . INGUINAL HERNIA REPAIR Right    Dr Paget    Allergies  Allergen Reactions  . Opioids - Morphine  Analogues Other (See Comments)    Decreases kidney function, nose itches    No current outpatient medications on file.   reports that he has been smoking cigarettes. He started smoking about 31 years ago. He has a 15.7 pack-year smoking history. He has never used smokeless tobacco. He reports that he does not drink alcohol and does not use drugs.  has no family status information on file.    Special Needs Assesment No special needs identified     History of Present Illness: Reason for Consult:  Inguinal hernia, left Frank Mills is a pleasant 63 y.o. year old male who is seen in consultation for evaluation of a Inguinal hernia, left.  Duration 2-3 months Location: Left groin Associated symptoms: Swelling, no signs or symptoms of incarceration strangulation or obstruction present Aggravating factors: standing, moderate exertion, and cough, tobacco abuse Aleviating factors: supine position Severity: moderate Imaging: none Previous treatment: none  Review of Systems  Constitutional:  Negative for chills, fever, malaise/fatigue and weight loss.  HENT:  Negative for congestion and sore throat.   Eyes:  Negative for double vision, photophobia and redness.  Respiratory:  Negative for cough, hemoptysis, shortness of breath, wheezing and stridor.   Cardiovascular:  Negative for chest pain, palpitations, orthopnea, claudication and leg swelling.  Gastrointestinal:  Negative for abdominal pain, blood in stool, constipation, diarrhea, melena, nausea and vomiting.  Genitourinary:  Negative for frequency, hematuria and urgency.  Musculoskeletal:  Negative for joint pain.  Skin:  Negative for itching and rash.  Neurological:  Negative for dizziness, tremors, focal weakness, seizures, loss of consciousness, weakness and headaches.  Endo/Heme/Allergies:  Negative for polydipsia. Does not bruise/bleed easily.  Psychiatric/Behavioral:  Negative for memory loss and suicidal ideas. The patient does not have insomnia.    Vital signs: BP 113/74   Pulse 58   Temp 36.2 C (97.1 F)   Ht 190.5 cm (6' 3)   Wt 97.2 kg (214 lb 4.8 oz)   BMI 26.79 kg/m   Physical Exam Constitutional:      General: He is not in acute distress.    Appearance: Normal appearance. He is well-developed. He is not ill-appearing, toxic-appearing or diaphoretic.  HENT:  Head: Normocephalic and atraumatic.     Right Ear: External ear normal.     Left Ear: External ear normal.     Nose: No congestion or  rhinorrhea.     Mouth/Throat:     Mouth: Mucous membranes are moist.     Pharynx: No oropharyngeal exudate or posterior oropharyngeal erythema.   Eyes:     General: No scleral icterus.       Right eye: No discharge.        Left eye: No discharge.     Extraocular Movements: Extraocular movements intact.     Conjunctiva/sclera: Conjunctivae normal.   Neck:     Thyroid: No thyromegaly.     Vascular: No JVD.     Trachea: No tracheal deviation.   Cardiovascular:     Rate and Rhythm: Normal rate and regular rhythm.     Heart sounds: Normal heart sounds. No murmur heard.    No friction rub. No gallop.  Pulmonary:     Effort: Pulmonary effort is normal. No respiratory distress.     Breath sounds: Normal breath sounds. No stridor. No wheezing, rhonchi or rales.  Chest:     Chest wall: No tenderness.  Abdominal:     General: Bowel sounds are normal. There is no distension.     Palpations: Abdomen is soft. There is no mass.     Tenderness: There is no abdominal tenderness. There is no right CVA tenderness, left CVA tenderness, guarding or rebound.     Hernia: No hernia is present.     Comments: Swelling noted left groin More pronounced when standing Suspect direct inguinal hernia Manually reducible Patient examined with aid of Valsalva maneuver   Musculoskeletal:        General: No tenderness or deformity. Normal range of motion.     Cervical back: Normal range of motion and neck supple. No rigidity or tenderness.  Lymphadenopathy:     Cervical: No cervical adenopathy.   Skin:    General: Skin is warm and dry.     Coloration: Skin is not pale.     Findings: No erythema or rash.   Neurological:     General: No focal deficit present.     Mental Status: He is alert and oriented to person, place, and time.     Cranial Nerves: No cranial nerve deficit.     Motor: No weakness.     Coordination: Coordination normal.     Gait: Gait normal.     Deep Tendon Reflexes: Reflexes are  normal and symmetric.   Psychiatric:        Mood and Affect: Mood normal.        Behavior: Behavior normal.        Thought Content: Thought content normal.        Judgment: Judgment normal.     Lab Results  Component Value Date   WBC 11.9 (H) 07/31/2022   HGB 15.1 07/31/2022   HCT 43.3 07/31/2022   PLT 207 07/31/2022    Lab Results  Component Value Date   NA 141 07/31/2022   K 4.0 07/31/2022   CL 106 07/31/2022   CO2 25.0 07/31/2022   BUN 22 (H) 07/31/2022   CREATININE 1.10 07/31/2022   GLU 121 07/31/2022   CALCIUM 8.8 07/31/2022   MG 1.9 07/31/2022    Lab Results  Component Value Date   BILITOT 0.2 (L) 07/31/2022   PROT 6.5 07/31/2022   ALBUMIN 2.9 (L) 07/31/2022   ALT  26 07/31/2022   AST 15 07/31/2022   ALKPHOS 64 07/31/2022   IMAGING:  US  Pelvis Limited: 12/28/2023  Impression  Possible left inguinal hernia containing fat. CT can be performed for further evaluation.   CT Abdomen Pelvis with contrast: 01/12/2024  Impression  1.    Fluid-filled proximal and mid small bowel loops with minimal wall thickening, nonspecific but suggestive of mild enteritis. Consider infectious and inflammatory etiologies No obstruction or free air.  2.    Fat-containing left inguinal hernia without inflammation or bowel loop involvement.  3.    Status post appendectomy.

## 2024-01-23 NOTE — Telephone Encounter (Signed)
 SABRA

## 2024-01-25 NOTE — Anesthesia Preprocedure Evaluation (Addendum)
 Procedure Information:  Date/Time: 01/30/24 1000   Procedure: REPAIR INITIAL INGUINAL HERNIA, AGE 63 YEARS OR OLDER;  REDUCIBLE OPEN (Left)   Anesthesia type: General   Pre-op diagnosis: left inguinal hernia   Location: OR [facility] / OR [facility]   Surgeons: Maranda Ellaree Bound, MD     Anesthesia Evaluation     Airway  Mallampati: II TM distance: >3 FB Mouth Opening: <3 FB   Dental - normal exam     Pulmonary - normal exam (+) current smoker Tobacco Counseling? Yes Abstained from Smoking on DOS? Packs/day?,    Cardiovascular - normal exam  ROS comment: Episode of Retrosternal Pressure radiating into Right Jaw With Diaphoresis, Shortness of Breath approx. 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 Cardiac Consult obtained.                 Anesthesia Plan  ASA 3     Anesthetic:  General  Standard lines and monitors.   Airway: laryngeal mask airway                                              Anesthesia plan and risk discussed with patient;.        Relevant Problems  No relevant active problems

## 2024-01-30 ENCOUNTER — Other Ambulatory Visit (HOSPITAL_COMMUNITY): Payer: Self-pay

## 2024-01-30 ENCOUNTER — Encounter (HOSPITAL_COMMUNITY): Payer: Self-pay | Admitting: Surgery

## 2024-01-30 DIAGNOSIS — R001 Bradycardia, unspecified: Secondary | ICD-10-CM | POA: Diagnosis not present

## 2024-01-30 DIAGNOSIS — R110 Nausea: Secondary | ICD-10-CM | POA: Diagnosis not present

## 2024-01-30 DIAGNOSIS — R079 Chest pain, unspecified: Secondary | ICD-10-CM

## 2024-01-30 DIAGNOSIS — R072 Precordial pain: Secondary | ICD-10-CM | POA: Diagnosis not present

## 2024-01-30 DIAGNOSIS — K4090 Unilateral inguinal hernia, without obstruction or gangrene, not specified as recurrent: Secondary | ICD-10-CM | POA: Diagnosis not present

## 2024-01-30 DIAGNOSIS — J9811 Atelectasis: Secondary | ICD-10-CM | POA: Diagnosis not present

## 2024-01-30 DIAGNOSIS — I209 Angina pectoris, unspecified: Secondary | ICD-10-CM

## 2024-01-30 DIAGNOSIS — R0789 Other chest pain: Secondary | ICD-10-CM | POA: Diagnosis not present

## 2024-01-30 DIAGNOSIS — F17210 Nicotine dependence, cigarettes, uncomplicated: Secondary | ICD-10-CM | POA: Diagnosis not present

## 2024-01-30 NOTE — ED Provider Notes (Signed)
 Emergency Department Provider Note    ED Clinical Impression   Final diagnoses:  Atypical chest pain (Primary)    ED Assessment/Plan    Condition: Stable Disposition: Discharge  This chart has been completed using Dragon Medical Dictation software, and while attempts have been made to ensure accuracy, certain words and phrases may not be transcribed as intended.   History   Chief Complaint  Patient presents with  . Chest Pain   HPI  Frank Mills is a 63 y.o. male  who presents today to the  emergency department complaining of midsternal chest pain that has been going on intermittently for the past few days.  Patient says the pain is aching, radiating to the right jaw.  Patient was scheduled for a left inguinal hernia repair today.  When he went to the hospital, he was complains of chest pain and nausea so he was sent to the ED for evaluation.  He was initially scheduled to have a stress test done on Thursday but that has been moved to tomorrow at [facility].   Allergies: is allergic to opioids - morphine  analogues. Medications: is not on any long-term medications. PMHx:  has a past medical history of Angina pectoris, DDD (degenerative disc disease), lumbar, Difficult intravenous access, Insomnia, Kidney stones, Palpitations, Shingles (08/01/2022), SOB (shortness of breath) on exertion, and Tobacco abuse. PSHx:  has a past surgical history that includes Lumbar fusion (2000); Appendectomy (2008); Inguinal hernia repair (Right, 1996); Lumbar fusion (2002); Thumb amputation (Left, 1983); and Nasal septum surgery. SocHx:  reports that he has been smoking cigarettes. He started smoking about 31 years ago. He has a 23.6 pack-year smoking history. He has never used smokeless tobacco. He reports that he does not drink alcohol and does not use drugs. Allergies, Medications, Medical, Surgical, and  Social History were reviewed as documented above.   Social Drivers of Health with Concerns   Food Insecurity: Food Insecurity Present (01/30/2024)   Hunger Vital Sign   . Worried About Programme researcher, broadcasting/film/video in the Last Year: Often true   . Ran Out of Food in the Last Year: Often true  Tobacco Use: High Risk (01/30/2024)   Patient History   . Smoking Tobacco Use: Every Day   . Smokeless Tobacco Use: Never   . Passive Exposure: Not on file  Physical Activity: Not on file  Stress: Not on file  Social Connections: Not on file  Financial Resource Strain: High Risk (01/30/2024)   Overall Financial Resource Strain (CARDIA)   . Difficulty of Paying Living Expenses: Hard  Health Literacy: Not on file  Internet Connectivity: Not on file     Review Of Systems  Review of Systems  Constitutional:  Negative for fever.  HENT:  Negative for congestion.   Respiratory:  Negative for chest tightness and shortness of breath.   Cardiovascular:  Positive for chest pain.  Gastrointestinal:  Negative for abdominal pain.  Skin:  Negative for color change.  Psychiatric/Behavioral:  Negative for behavioral problems.   All other systems reviewed and are negative.   Physical Exam   BP 122/81   Pulse 55   Temp 37 C (98.6 F) (Oral)   Resp 18   SpO2 98%   Physical Exam Vitals and nursing note reviewed.  Constitutional:  General: He is not in acute distress. HENT:     Head: Normocephalic.   Eyes:     Conjunctiva/sclera: Conjunctivae normal.    Cardiovascular:     Rate and Rhythm: Regular rhythm.     Pulses: Normal pulses.     Heart sounds: Normal heart sounds.  Pulmonary:     Effort: No respiratory distress.     Breath sounds: Normal breath sounds.  Abdominal:     General: There is no distension.     Tenderness: There is no abdominal tenderness. There is no guarding or rebound.     Hernia: No hernia is present.   Musculoskeletal:        General: No deformity.   Skin:    General:  Skin is warm.     Capillary Refill: Capillary refill takes 2 to 3 seconds.     Comments: Normal cap refill.   Neurological:     General: No focal deficit present.     Mental Status: He is oriented to person, place, and time.     Cranial Nerves: No cranial nerve deficit.     Sensory: No sensory deficit.   Psychiatric:        Mood and Affect: Mood normal.     ED Course  Medical Decision Making Differential is includes acute coronary syndrome versus pulmonary embolus versus atypical chest pain.  EKG shows normal sinus rhythm at 52 beats per.  Normal axis.  Normal intervals.  Normal EKG.  3:28 PM Patient is doing well.  He wants to go home.  I have begged him to let me get a second troponin.  Troponin is negative.  He really does not want to stay.  I think if his second troponin is negative, he remains asymptomatic, I think it is reasonable for him to go home since he has a stress test scheduled for tomorrow morning.  4:19 PM Patient is doing well.  Heart score is 2.  High-sensitivity troponin negative x 2.  CTA negative.  Patient would like to go home.  Said decision making done with patient.  I think it is reasonable.  I have counseled him to return if states having any more chest pain, shortness of breath or any other concerns.  Otherwise, he has to follow-up tomorrow morning for his stress test.  I have reviewed my clinical findings and studies and my clinical impression with the patient. The patient has expressed understanding that at this time there is no evidence for a more malignant underlying process, but the patient also understands that early in the process of a condition such as this, an initial workup can be falsely reassuring. I have counseled the patient and discussed follow-up with the patient, stressing the importance of appropriate follow-up. I have also counseled the patient to return if worse or any concerns. Routine discharge counseling was given to the patient and the  patient understands that worsening, changing or persistent symptoms should prompt an immediate call or follow up with their primary physician or return to the emergency department for reevaluation. Patient has expressed understanding.     Problems Addressed: Atypical chest pain: acute illness or injury that poses a threat to life or bodily functions  Amount and/or Complexity of Data Reviewed Labs: ordered. Decision-making details documented in ED Course. Radiology: ordered. Decision-making details documented in ED Course. ECG/medicine tests: ordered and independent interpretation performed. Decision-making details documented in ED Course.  Risk Decision regarding hospitalization.     Procedures   Encounter Date:  01/30/24  ECG 12 Lead  Result Value   EKG Systolic BP    EKG Diastolic BP    EKG Ventricular Rate 46   EKG Atrial Rate 46   EKG P-R Interval 164   EKG QRS Duration 98   EKG Q-T Interval 446   EKG QTC Calculation 390   EKG Calculated P Axis 38   EKG Calculated R Axis -5   EKG Calculated T Axis 24   QTC Fredericia 408   Narrative   Sinus bradycardia with premature atrial complexes Otherwise normal ECG When compared with ECG of 30-Jan-2024 12:56, premature atrial complexes are now present Confirmed by Cherie Searle ([id number]) on 01/30/2024 4:17:39 PM     ED Results Results for orders placed or performed during the hospital encounter of 01/30/24  Comprehensive Metabolic Panel  Result Value Ref Range   Sodium 143 135 - 145 mmol/L   Potassium 4.3 3.5 - 5.0 mmol/L   Chloride 108 (H) 98 - 107 mmol/L   CO2 26.5 21.0 - 32.0 mmol/L   Anion Gap 9 3 - 11 mmol/L   BUN 9 8 - 20 mg/dL   Creatinine 9.07 9.19 - 1.30 mg/dL   BUN/Creatinine Ratio 10    eGFR CKD-EPI (2021) Male >90 >=60 mL/min/1.89m2   Glucose 101 70 - 179 mg/dL   Calcium 8.6 8.5 - 89.8 mg/dL   Albumin 3.4 (L) 3.5 - 5.0 g/dL   Total Protein 6.5 6.0 - 8.0 g/dL   Total Bilirubin 0.4 0.3 - 1.2 mg/dL   AST 15  15 - 40 U/L   ALT 18 12 - 78 U/L   Alkaline Phosphatase 55 46 - 116 U/L  Magnesium  Level  Result Value Ref Range   Magnesium  2.2 1.6 - 2.6 mg/dL  hsTroponin I (serial 9-7-3Y w/ delta)  Result Value Ref Range   hsTroponin I 4 <=53 ng/L  hsTroponin I - 2 Hour  Result Value Ref Range   hsTroponin I 5 <=53 ng/L   delta hsTroponin I 1 <=7 ng/L  ECG 12 Lead  Result Value Ref Range   EKG Systolic BP  mmHg   EKG Diastolic BP  mmHg   EKG Ventricular Rate 46 BPM   EKG Atrial Rate 46 BPM   EKG P-R Interval 164 ms   EKG QRS Duration 98 ms   EKG Q-T Interval 446 ms   EKG QTC Calculation 390 ms   EKG Calculated P Axis 38 degrees   EKG Calculated R Axis -5 degrees   EKG Calculated T Axis 24 degrees   QTC Fredericia 408 ms  CBC w/ Differential  Result Value Ref Range   WBC 8.5 4.0 - 10.5 10*9/L   RBC 4.64 4.10 - 5.60 10*12/L   HGB 14.8 12.5 - 17.0 g/dL   HCT 57.1 63.9 - 49.9 %   MCV 92.2 80.0 - 98.0 fL   MCH 31.9 27.0 - 34.0 pg   MCHC 34.6 32.0 - 36.0 g/dL   RDW 86.7 88.4 - 85.4 %   MPV 10.4 7.4 - 10.4 fL   Platelet 207 140 - 415 10*9/L   Neutrophils % 62.0 %   Lymphocytes % 21.0 %   Monocytes % 8.1 %   Eosinophils % 7.3 %   Basophils % 1.2 %   Absolute Neutrophils 5.3 1.8 - 7.8 10*9/L   Absolute Lymphocytes 1.8 0.7 - 4.5 10*9/L   Absolute Monocytes 0.7 0.1 - 1.0 10*9/L   Absolute Eosinophils 0.6 (H) 0.0 - 0.4 10*9/L  Absolute Basophils 0.1 0.0 - 0.2 10*9/L  Results for orders placed or performed in visit on 01/30/24  ECG 12 Lead  Result Value Ref Range   EKG Systolic BP  mmHg   EKG Diastolic BP  mmHg   EKG Ventricular Rate 58 BPM   EKG Atrial Rate 58 BPM   EKG P-R Interval 164 ms   EKG QRS Duration 98 ms   EKG Q-T Interval 404 ms   EKG QTC Calculation 396 ms   EKG Calculated P Axis 42 degrees   EKG Calculated R Axis -5 degrees   EKG Calculated T Axis 66 degrees   QTC Fredericia 399 ms   ECG 12 Lead Result Date: 01/30/2024 Sinus bradycardia with premature atrial  complexes Otherwise normal ECG When compared with ECG of 30-Jan-2024 12:56, premature atrial complexes are now present Confirmed by Cherie Searle ([id number]) on 01/30/2024 4:17:39 PM  CTA Chest W Contrast Result Date: 01/30/2024 Exam:  CT Angiogram Chest (Pulmonary Embolism Protocol)  History:  Intermittent chest pain.  Technique: Chest CTA with IV contrast (pulmonary embolism protocol). This examination was specifically tailored to evaluate the pulmonary arteries for the presence of intraluminal thrombus.  Volumetric axial CT Maximum Intensity Projection (MIP) pulmonary angiographic images were generated at the scanner and sent to PACS for review. AEC (automated exposure control) and/or manual techniques such as size-specific kV and mAs are employed where appropriate to reduce radiation exposure for all CT exams.  Comparison:  None  Chest CT Findings:  PULMONARY ARTERIES: No acute pulmonary embolism. Pulmonary arteries normal caliber.  LUNGS: Clear central tracheobronchial tree. Passive bibasilar subsegmental atelectasis. No frank consolidation. No overtly suspicious pulmonary nodules, though evaluation is limited by respiratory motion accessory phase imaging.  PLEURA: No pleural effusion or pneumothorax.  HEART: Normal heart size. No pericardial effusion. No significant coronary artery calcifications.  AORTA: No acute pathology. Normal caliber. No significant atherosclerosis.  MEDIASTINUM: No lymphadenopathy.  BONES: Sclerotic lesion within the right ninth lateral rib, favoring a benign bone island. Otherwise, no aggressive osseous lesions.  SOFT TISSUES: Normal.  UPPER ABDOMEN: Left hepatic lobe hypoattenuating lesion favored to represent a cyst. Otherwise, limited views of the abdomen are grossly unremarkable.    1.    No acute pulmonary embolism. 2.    No acute pulmonary pathology.  Signed (Electronic Signature): 01/30/2024 2:56 PM Signed By: Worth Pih, MD  XR Chest Portable Result Date: 01/30/2024 Exam:   Portable Chest  History:  Chest pain.  Technique:  Single frontal view.  Comparison:  Chest x-ray dated 07/31/2022.  Findings:   The heart and mediastinum are stable. The trachea is midline. There is no new airspace disease identified. There is no visible pneumothorax or pleural effusion. The osseous structures are unchanged.    No acute intrathoracic pathology identified.  Signed (Electronic Signature): 01/30/2024 1:30 PM Signed By: Cleatus SHAUNNA Brazier, MD   Medications Administered:  Medications  iohexol (OMNIPAQUE) 350 mg iodine/mL solution 75 mL (75 mL Intravenous Given 01/30/24 1439)    Discharge Medications (Medications Prescribed during this  ED visit and Patient's Home Medications) :    Your Medication List    You have not been prescribed any medications.       Cherie Searle Hanger, MD 01/30/24 1620

## 2024-01-31 ENCOUNTER — Ambulatory Visit (HOSPITAL_COMMUNITY)
Admission: RE | Admit: 2024-01-31 | Discharge: 2024-01-31 | Disposition: A | Discharge status: Home / Self Care | Source: Ambulatory Visit | Attending: Cardiology | Admitting: Cardiology

## 2024-01-31 DIAGNOSIS — R079 Chest pain, unspecified: Secondary | ICD-10-CM | POA: Insufficient documentation

## 2024-01-31 MED ORDER — TECHNETIUM TC 99M TETROFOSMIN IV KIT
11.0000 | PACK | Freq: Once | INTRAVENOUS | Status: AC | PRN
  Administered 2024-01-31: 11 via INTRAVENOUS

## 2024-01-31 MED ORDER — REGADENOSON 0.4 MG/5ML IV SOLN
0.4000 mg | Freq: Once | INTRAVENOUS | Status: AC
  Administered 2024-01-31: 0.4 mg via INTRAVENOUS

## 2024-01-31 MED ORDER — REGADENOSON 0.4 MG/5ML IV SOLN
INTRAVENOUS | Status: AC
  Filled 2024-01-31: qty 5

## 2024-01-31 MED ORDER — TECHNETIUM TC 99M TETROFOSMIN IV KIT
30.7000 | PACK | Freq: Once | INTRAVENOUS | Status: AC | PRN
Start: 2024-01-31 — End: 2024-01-31
  Administered 2024-01-31: 30.7 via INTRAVENOUS

## 2024-02-01 ENCOUNTER — Encounter (HOSPITAL_COMMUNITY): Payer: Self-pay

## 2024-02-01 ENCOUNTER — Other Ambulatory Visit (HOSPITAL_COMMUNITY)

## 2024-02-01 LAB — MYOCARDIAL PERFUSION IMAGING
Base ST Depression (mm): 0 mm
LV dias vol: 122 mL (ref 62–150)
LV sys vol: 54 mL (ref 4.2–5.8)
Nuc Stress EF: 56 %
Peak HR: 95 {beats}/min
Rest HR: 53 {beats}/min
Rest Nuclear Isotope Dose: 11 mCi
SDS: 1
SRS: 3
SSS: 3
ST Depression (mm): 0 mm
Stress Nuclear Isotope Dose: 30.4 mCi
TID: 1.23

## 2024-02-05 DIAGNOSIS — R0789 Other chest pain: Secondary | ICD-10-CM | POA: Diagnosis not present

## 2024-02-08 DIAGNOSIS — Z720 Tobacco use: Secondary | ICD-10-CM | POA: Diagnosis not present

## 2024-02-08 DIAGNOSIS — R0789 Other chest pain: Secondary | ICD-10-CM | POA: Diagnosis not present

## 2024-02-20 DIAGNOSIS — K4090 Unilateral inguinal hernia, without obstruction or gangrene, not specified as recurrent: Secondary | ICD-10-CM | POA: Diagnosis not present

## 2024-02-20 DIAGNOSIS — F17200 Nicotine dependence, unspecified, uncomplicated: Secondary | ICD-10-CM | POA: Diagnosis not present

## 2024-02-20 DIAGNOSIS — Z885 Allergy status to narcotic agent status: Secondary | ICD-10-CM | POA: Diagnosis not present

## 2024-02-22 DIAGNOSIS — G8918 Other acute postprocedural pain: Secondary | ICD-10-CM | POA: Diagnosis not present

## 2024-02-22 DIAGNOSIS — Z743 Need for continuous supervision: Secondary | ICD-10-CM | POA: Diagnosis not present

## 2024-02-22 DIAGNOSIS — R109 Unspecified abdominal pain: Secondary | ICD-10-CM | POA: Diagnosis not present

## 2024-02-22 DIAGNOSIS — F17210 Nicotine dependence, cigarettes, uncomplicated: Secondary | ICD-10-CM | POA: Diagnosis not present

## 2024-02-22 DIAGNOSIS — R195 Other fecal abnormalities: Secondary | ICD-10-CM | POA: Diagnosis not present

## 2024-04-12 DIAGNOSIS — R1031 Right lower quadrant pain: Secondary | ICD-10-CM | POA: Diagnosis not present

## 2024-04-12 DIAGNOSIS — K4090 Unilateral inguinal hernia, without obstruction or gangrene, not specified as recurrent: Secondary | ICD-10-CM | POA: Diagnosis not present

## 2024-04-15 DIAGNOSIS — G4700 Insomnia, unspecified: Secondary | ICD-10-CM | POA: Diagnosis not present

## 2024-04-15 DIAGNOSIS — K4090 Unilateral inguinal hernia, without obstruction or gangrene, not specified as recurrent: Secondary | ICD-10-CM | POA: Diagnosis not present

## 2024-04-15 DIAGNOSIS — K4091 Unilateral inguinal hernia, without obstruction or gangrene, recurrent: Secondary | ICD-10-CM | POA: Diagnosis not present

## 2024-04-15 DIAGNOSIS — Z9049 Acquired absence of other specified parts of digestive tract: Secondary | ICD-10-CM | POA: Diagnosis not present

## 2024-04-15 DIAGNOSIS — F17210 Nicotine dependence, cigarettes, uncomplicated: Secondary | ICD-10-CM | POA: Diagnosis not present

## 2024-04-15 DIAGNOSIS — K4190 Unilateral femoral hernia, without obstruction or gangrene, not specified as recurrent: Secondary | ICD-10-CM | POA: Diagnosis not present
